# Patient Record
Sex: Male | Born: 1975
Health system: Southern US, Community
[De-identification: ages and names within clinical notes are randomized; demographics above are authoritative.]

## PROBLEM LIST (undated history)

## (undated) DIAGNOSIS — R7303 Prediabetes: Secondary | ICD-10-CM

## (undated) DIAGNOSIS — R7989 Other specified abnormal findings of blood chemistry: Secondary | ICD-10-CM

## (undated) DIAGNOSIS — T7840XA Allergy, unspecified, initial encounter: Secondary | ICD-10-CM

## (undated) DIAGNOSIS — R945 Abnormal results of liver function studies: Secondary | ICD-10-CM

## (undated) HISTORY — DX: Abnormal results of liver function studies: R94.5

## (undated) HISTORY — DX: Prediabetes: R73.03

## (undated) HISTORY — DX: Allergy, unspecified, initial encounter: T78.40XA

## (undated) HISTORY — DX: Other specified abnormal findings of blood chemistry: R79.89

---

## 2009-08-03 ENCOUNTER — Emergency Department (HOSPITAL_COMMUNITY): Admission: EM | Admit: 2009-08-03 | Discharge: 2009-08-03 | Payer: Self-pay | Admitting: Emergency Medicine

## 2011-02-02 ENCOUNTER — Emergency Department (HOSPITAL_COMMUNITY)
Admission: EM | Admit: 2011-02-02 | Discharge: 2011-02-02 | Discharge status: Against Medical Advice | Payer: No Typology Code available for payment source | Attending: Emergency Medicine | Admitting: Emergency Medicine

## 2011-02-02 DIAGNOSIS — Z0389 Encounter for observation for other suspected diseases and conditions ruled out: Secondary | ICD-10-CM | POA: Insufficient documentation

## 2012-03-31 ENCOUNTER — Other Ambulatory Visit: Payer: Self-pay | Admitting: Internal Medicine

## 2012-03-31 NOTE — Telephone Encounter (Signed)
PT STATES HIS PHARMACY HAVE BEEN TRYING TO GET HIS ZOLPIZEM REFILLED AND HASN'T HEARD FROM ANYONE PLEASE CALL PT AT [phone]   [facility] ON [address]

## 2012-07-07 ENCOUNTER — Encounter (HOSPITAL_COMMUNITY): Payer: Self-pay | Admitting: *Deleted

## 2012-07-07 ENCOUNTER — Emergency Department (HOSPITAL_COMMUNITY): Payer: Self-pay

## 2012-07-07 ENCOUNTER — Emergency Department (HOSPITAL_COMMUNITY)
Admission: EM | Admit: 2012-07-07 | Discharge: 2012-07-07 | Disposition: A | Discharge status: Home / Self Care | Payer: Self-pay | Attending: Emergency Medicine | Admitting: Emergency Medicine

## 2012-07-07 DIAGNOSIS — J40 Bronchitis, not specified as acute or chronic: Secondary | ICD-10-CM | POA: Insufficient documentation

## 2012-07-07 DIAGNOSIS — J019 Acute sinusitis, unspecified: Secondary | ICD-10-CM

## 2012-07-07 MED ORDER — BENZONATATE 100 MG PO CAPS: 100.0000 mg | ORAL_CAPSULE | Freq: Three times a day (TID) | ORAL | Status: AC

## 2012-07-07 MED ORDER — AZITHROMYCIN 250 MG PO TABS: 250.0000 mg | ORAL_TABLET | Freq: Every day | ORAL | Status: AC

## 2012-07-07 NOTE — ED Notes (Signed)
Pt reports nonproductive dry hacking cough x 3 weeks. denies any fevers.

## 2012-07-07 NOTE — ED Notes (Signed)
Provider at bedside

## 2012-07-07 NOTE — ED Provider Notes (Signed)
History     CSN: [id number]  Arrival date & time 07/07/12  1100   First MD Initiated Contact with Patient 07/07/12 1410      Chief Complaint  Patient presents with  . Cough    (Consider location/radiation/quality/duration/timing/severity/associated sxs/prior treatment) HPI Comments: Patient presents today with a chief complaint of dry cough, nasal congestion, and sinus pressure for the past 3 weeks.  He does not smoke and states that he has never smoked.    Patient is a 36 y.o. male presenting with cough. The history is provided by the patient.  Cough This is a new problem. Episode onset: 3 weeks ago. The problem has been gradually worsening. The cough is non-productive. There has been no fever. Associated symptoms include rhinorrhea. Pertinent negatives include no chest pain, no chills, no ear congestion, no ear pain, no sore throat, no shortness of breath and no wheezing. Treatments tried: Mucinex. The treatment provided no relief. He is not a smoker. His past medical history does not include bronchitis, pneumonia, COPD or asthma.    History reviewed. No pertinent past medical history.  History reviewed. No pertinent past surgical history.  History reviewed. No pertinent family history.  History  Substance Use Topics  . Smoking status: Never Smoker   . Smokeless tobacco: Not on file  . Alcohol Use:       Review of Systems  Constitutional: Negative for fever and chills.  HENT: Positive for congestion, rhinorrhea, postnasal drip and sinus pressure. Negative for ear pain and sore throat.   Respiratory: Positive for cough. Negative for chest tightness, shortness of breath and wheezing.   Cardiovascular: Negative for chest pain.    Allergies  Review of patient's allergies indicates no known allergies.  Home Medications   Current Outpatient Rx  Name Route Sig Dispense Refill  . GUAIFENESIN ER 1200 MG PO TB12 Oral Take 1 tablet by mouth every 12 (twelve) hours as needed.  As needed for cough and congestion.    Marland Kitchen ZOLPIDEM TARTRATE 10 MG PO TABS Oral Take 10 mg by mouth at bedtime as needed. As needed for sleep.      BP 123/82  Pulse 91  Temp 98.2 F (36.8 C) (Oral)  Resp 18  SpO2 99%  Physical Exam  Nursing note and vitals reviewed. Constitutional: He appears well-developed and well-nourished. No distress.  HENT:  Head: Normocephalic and atraumatic.  Right Ear: Tympanic membrane and ear canal normal.  Left Ear: Tympanic membrane and ear canal normal.  Nose: Mucosal edema present. Right sinus exhibits frontal sinus tenderness. Right sinus exhibits no maxillary sinus tenderness. Left sinus exhibits frontal sinus tenderness. Left sinus exhibits no maxillary sinus tenderness.  Mouth/Throat: Uvula is midline, oropharynx is clear and moist and mucous membranes are normal.  Neck: Normal range of motion. Neck supple.  Cardiovascular: Normal rate, regular rhythm and normal heart sounds.   Pulmonary/Chest: Effort normal and breath sounds normal. No respiratory distress. He has no wheezes. He has no rales.  Neurological: He is alert.  Skin: Skin is warm and dry. He is not diaphoretic.  Psychiatric: He has a normal mood and affect.    ED Course  Procedures (including critical care time)  Labs Reviewed - No data to display Dg Chest 2 View  07/07/2012  *RADIOLOGY REPORT*  Clinical Data: Cough.  CHEST - 2 VIEW  Comparison: 01/26/2011.  Findings: The heart size and mediastinal contours are stable. There is central airway thickening without hyperinflation.  There is no confluent airspace disease,  edema or pleural effusion.  The osseous structures appear normal.  IMPRESSION: Chronic central airway thickening consistent with bronchitis.  No evidence of pneumonia or other acute process.   Original Report Authenticated By: Gerrianne Scale, M.D.      No diagnosis found.    MDM  Xray consistent with bronchitis.  Patient does not smoke and states that he has never  smoked.  He is also complaining of sinus pressure and congestion for the past 3 weeks.  Patient treated with Azithromycin and instructed to take a decongestant.  VSS.  Pulse ox 99 on RA.  Patient discharged home.          Pascal Lux Huntington, PA-C 07/08/12 1133

## 2012-07-08 NOTE — ED Provider Notes (Signed)
Medical screening examination/treatment/procedure(s) were performed by non-physician practitioner and as supervising physician I was immediately available for consultation/collaboration. Devoria Albe, MD, Armando Gang   Ward Givens, MD 07/08/12 2011

## 2012-12-02 ENCOUNTER — Ambulatory Visit: Payer: Self-pay | Admitting: Emergency Medicine

## 2012-12-02 VITALS — BP 115/75 | HR 65 | Temp 97.6°F | Resp 16 | Ht 70.25 in | Wt 176.6 lb

## 2012-12-02 DIAGNOSIS — Z0289 Encounter for other administrative examinations: Secondary | ICD-10-CM

## 2012-12-02 NOTE — Progress Notes (Signed)
  Subjective:    Patient ID: Mitchell Schmidt, male    DOB: [birth year], 37 y.o.   MRN: [id number]  HPI  DOT physical   Review of Systems  As per HPI, otherwise negative.      Objective:   Physical Exam  GEN: WDWN, NAD, Non-toxic, A & O x 3 HEENT: Atraumatic, Normocephalic. Neck supple. No masses, No LAD. Ears and Nose: No external deformity. CV: RRR, No M/G/R. No JVD. No thrill. No extra heart sounds. PULM: CTA B, no wheezes, crackles, rhonchi. No retractions. No resp. distress. No accessory muscle use. ABD: S, NT, ND, +BS. No rebound. No HSM. EXTR: No c/c/e NEURO Normal gait.  PSYCH: Normally interactive. Conversant. Not depressed or anxious appearing.  Calm demeanor.        Assessment & Plan:    DOT completed

## 2012-12-08 ENCOUNTER — Encounter: Payer: Self-pay | Admitting: Emergency Medicine

## 2013-03-21 ENCOUNTER — Other Ambulatory Visit: Payer: Self-pay | Admitting: Physician Assistant

## 2013-03-22 ENCOUNTER — Telehealth: Payer: Self-pay

## 2013-03-22 NOTE — Telephone Encounter (Signed)
Pt is needing a refill on [employer] number [phone]

## 2013-03-22 NOTE — Telephone Encounter (Signed)
I dont see where we prescribed this.

## 2013-03-23 ENCOUNTER — Other Ambulatory Visit: Payer: Self-pay | Admitting: Internal Medicine

## 2013-03-23 ENCOUNTER — Ambulatory Visit: Payer: Self-pay | Admitting: Family Medicine

## 2013-03-23 VITALS — BP 120/80 | HR 90 | Temp 98.5°F | Resp 17 | Ht 70.5 in | Wt 178.0 lb

## 2013-03-23 DIAGNOSIS — G4700 Insomnia, unspecified: Secondary | ICD-10-CM

## 2013-03-23 DIAGNOSIS — Z131 Encounter for screening for diabetes mellitus: Secondary | ICD-10-CM

## 2013-03-23 LAB — COMPREHENSIVE METABOLIC PANEL
ALT: 62 U/L — ABNORMAL HIGH (ref 0–53)
Albumin: 4.5 g/dL (ref 3.5–5.2)
CO2: 26 mEq/L (ref 19–32)
Calcium: 9.5 mg/dL (ref 8.4–10.5)
Chloride: 103 mEq/L (ref 96–112)
Creat: 1.28 mg/dL (ref 0.50–1.35)
Sodium: 138 mEq/L (ref 135–145)
Total Protein: 8.1 g/dL (ref 6.0–8.3)

## 2013-03-23 LAB — COMPREHENSIVE METABOLIC PANEL WITH GFR
AST: 35 U/L (ref 0–37)
Alkaline Phosphatase: 97 U/L (ref 39–117)
BUN: 15 mg/dL (ref 6–23)
Glucose, Bld: 114 mg/dL — ABNORMAL HIGH (ref 70–99)
Potassium: 4.2 meq/L (ref 3.5–5.3)
Total Bilirubin: 0.4 mg/dL (ref 0.3–1.2)

## 2013-03-23 LAB — POCT GLYCOSYLATED HEMOGLOBIN (HGB A1C): Hemoglobin A1C: 6.3

## 2013-03-23 MED ORDER — ZOLPIDEM TARTRATE 10 MG PO TABS: 10.0000 mg | ORAL_TABLET | Freq: Every evening | ORAL | Status: AC | PRN

## 2013-03-23 NOTE — Progress Notes (Signed)
[facility]:  Office Visit  Chief Complaint:  Chief Complaint  Patient presents with  . Medication Refill    HPI: Mitchell Schmidt is a 37 y.o. male who is here for medication refill for Zolpidem.  Has problems falling asleep. Only occasional. No difficulty maintaining sleep. 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  He takes at most 2 ambiens weekly. He denies any abuse issues, he does not take medication before driving. He states he gives himself 10 hours before driving after med use.   He was told in the past that he may have diabetes at another clinic. He would like to get rechecked. I believe he was rx metformin due to dx of pre-diabetes but is not taking it. Denies neuropathy, denies excessive polydipsia.     Past Medical History  Diagnosis Date  . Pre-diabetes   . Allergy    History reviewed. No pertinent past surgical history. History   Social History  . Marital Status: Married    Spouse Name: N/A    Number of Children: N/A  . Years of Education: N/A   Social History Main Topics  . Smoking status: Never Smoker   . Smokeless tobacco: None  . Alcohol Use: No  . Drug Use: No  . Sexually Active: None   Other Topics Concern  . None   Social History Narrative  . None   History reviewed. No pertinent family history. No Known Allergies Prior to Admission medications   Medication Sig Start Date End Date Taking? Authorizing Provider  Guaifenesin [facility] MAXIMUM STRENGTH) 1200 MG TB12 Take 1 tablet by mouth every 12 (twelve) hours as needed. As needed for cough and congestion.   Yes Historical Provider, MD  metFORMIN (GLUCOPHAGE) 500 MG tablet Take 500 mg by mouth once.   Yes Historical Provider, MD  zolpidem  (AMBIEN) 10 MG tablet Take 10 mg by mouth at bedtime as needed. As needed for sleep.   Yes Historical Provider, MD     ROS: The patient denies fevers, chills, night sweats, unintentional weight loss, chest pain, palpitations, wheezing, dyspnea on exertion, nausea, vomiting, abdominal pain, dysuria, hematuria, melena, numbness, weakness, or tingling.   All other systems have been reviewed and were otherwise negative with the exception of those mentioned in the HPI and as above.    PHYSICAL EXAM: Filed Vitals:   03/23/13 1206  BP: 120/80  Pulse: 90  Temp: 98.5 F (36.9 C)  Resp: 17   Filed Vitals:   03/23/13 1206  Height: 5' 10.5" (1.791 m)  Weight: 178 lb (80.74 kg)   Body mass index is 25.17 kg/(m^2).  General: Alert, no acute distress HEENT:  Normocephalic, atraumatic, oropharynx patent. EOMI, PERRLA.  Cardiovascular:  Regular rate and rhythm, no rubs murmurs or gallops.  No Carotid bruits, radial pulse intact. No pedal edema.  Respiratory: Clear to auscultation bilaterally.  No wheezes, rales, or rhonchi.  No cyanosis, no use of accessory musculature GI: No organomegaly, abdomen is soft and non-tender, positive bowel sounds.  No masses. Skin: No rashes. Neurologic: Facial musculature symmetric. Psychiatric: Patient is appropriate throughout our interaction. Lymphatic: No cervical lymphadenopathy Musculoskeletal: Gait intact.   LABS: Results for orders placed  in visit on 03/23/13  POCT GLYCOSYLATED HEMOGLOBIN (HGB A1C)      Result Value Range   Hemoglobin A1C 6.3       EKG/XRAY:   Primary read interpreted by Dr. Conley Rolls at [facility]   ASSESSMENT/PLAN: Encounter Diagnoses  Name Primary?  . Screening for diabetes mellitus Yes  . Insomnia     D/w patient isk and benefits of taking ambien. Advise to take 1/2 -1 tab PO qhs prn insomnia Rx Ambiem 10 mg #30, 3 Refills written D/w  patient pre-diabetes dx  and what he needs to do Patient is not taking metformin and his HbA1c  is 6.3. C/w lifestyle changes.  He will folllow-up in 6 months for check up and repeat HbA1c. CMP pending F/u prn  Jamison Soward PHUONG, DO 03/23/2013 1:14 PM

## 2013-03-23 NOTE — Telephone Encounter (Signed)
I have called him, left message for him to call back

## 2013-03-23 NOTE — Telephone Encounter (Signed)
Patient advised.

## 2013-03-23 NOTE — Telephone Encounter (Signed)
PATIENT CALLED BACK. [phone]

## 2013-03-24 ENCOUNTER — Encounter: Payer: Self-pay | Admitting: Family Medicine

## 2013-03-24 MED ORDER — METFORMIN HCL 500 MG PO TABS: 500.0000 mg | ORAL_TABLET | Freq: Once | ORAL | Status: DC

## 2013-04-01 ENCOUNTER — Telehealth: Payer: Self-pay | Admitting: Family Medicine

## 2013-04-01 NOTE — Telephone Encounter (Signed)
Spoke to patient about labs, he is not on metformin. He will return in 6 months to see if he needs to be on it. HbA1c was 6.3. He will modify lifestyle. CMP was fairly normal.

## 2013-09-28 ENCOUNTER — Other Ambulatory Visit: Payer: Self-pay | Admitting: Family Medicine

## 2013-11-01 ENCOUNTER — Ambulatory Visit (INDEPENDENT_AMBULATORY_CARE_PROVIDER_SITE_OTHER): Payer: BC Managed Care – PPO | Admitting: Family Medicine

## 2013-11-01 VITALS — BP 100/62 | HR 71 | Temp 98.8°F | Resp 18 | Ht 70.5 in | Wt 175.0 lb

## 2013-11-01 DIAGNOSIS — Z131 Encounter for screening for diabetes mellitus: Secondary | ICD-10-CM

## 2013-11-01 DIAGNOSIS — G4700 Insomnia, unspecified: Secondary | ICD-10-CM

## 2013-11-01 DIAGNOSIS — R350 Frequency of micturition: Secondary | ICD-10-CM

## 2013-11-01 LAB — POCT UA - MICROSCOPIC ONLY
Bacteria, U Microscopic: NEGATIVE
Casts, Ur, LPF, POC: NEGATIVE
Crystals, Ur, HPF, POC: NEGATIVE
Epithelial cells, urine per micros: NEGATIVE
Mucus, UA: NEGATIVE
Yeast, UA: NEGATIVE

## 2013-11-01 LAB — POCT URINALYSIS DIPSTICK
Bilirubin, UA: NEGATIVE
Blood, UA: NEGATIVE
Glucose, UA: NEGATIVE
Ketones, UA: NEGATIVE
Leukocytes, UA: NEGATIVE
Nitrite, UA: NEGATIVE
Protein, UA: NEGATIVE
Spec Grav, UA: 1.02
Urobilinogen, UA: 0.2
pH, UA: 7

## 2013-11-01 LAB — POCT GLYCOSYLATED HEMOGLOBIN (HGB A1C): Hemoglobin A1C: 6.3

## 2013-11-01 MED ORDER — ZOLPIDEM TARTRATE 10 MG PO TABS: 10.0000 mg | ORAL_TABLET | Freq: Every evening | ORAL | Status: DC | PRN

## 2013-11-01 NOTE — Progress Notes (Signed)
Chief Complaint:  Chief Complaint  Patient presents with  . Follow-up    dm     HPI: Mitchell Schmidt is a 37 y.o. male who is here for: Mitchell Schmidt is a 37 y.o. male who is here for medication refill for Zolpidem. Has problems falling asleep. Only occasional. No difficulty maintaining sleep. 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 He takes at most 2 ambiens weekly. He denies any abuse issues, he does not take medication before driving. He states he gives himself 10 hours before driving after med use.  He was told in the past that he may have diabetes at another clinic. He would like to get rechecked. I believe he was rx metformin due to dx of pre-diabetes but is not taking it. We are monitoring his HbA1c last one was 6.3. Denies neuropathy, denies excessive polydipsia. Last 2 weeks he has had increase urinary frequency only at night  Past Medical History  Diagnosis Date  . Allergy   . Pre-diabetes    History reviewed. No pertinent past surgical history. History   Social History  . Marital Status: Married    Spouse Name: N/A    Number of Children: N/A  . Years of Education: N/A   Social History Main Topics  . Smoking status: Never Smoker   . Smokeless tobacco: None  . Alcohol Use: No  . Drug Use: No  . Sexual Activity: None   Other Topics Concern  . None   Social History Narrative  . None   History reviewed. No pertinent family history. No Known Allergies Prior to Admission medications   Medication Sig Start Date End Date Taking? Authorizing Provider  metFORMIN (GLUCOPHAGE) 500 MG tablet Take 500 mg by mouth 2 (two) times daily with a meal.   Yes Historical Provider, MD  zolpidem (AMBIEN) 10 MG tablet take 1 tablet by  mouth at bedtime if needed for sleep 09/28/13   Odyn Turko P Candice Tobey, DO     ROS: The patient denies fevers, chills, night sweats, unintentional weight loss, chest pain, palpitations, wheezing, dyspnea on exertion, nausea, vomiting, abdominal pain, dysuria, hematuria, melena, numbness, weakness, or tingling.   All other systems have been reviewed and were otherwise negative with the exception of those mentioned in the HPI and as above.    PHYSICAL EXAM: Filed Vitals:   11/01/13 1122  BP: 100/62  Pulse: 71  Temp: 98.8 F (37.1 C)  Resp: 18   Filed Vitals:   11/01/13 1122  Height: 5' 10.5" (1.791 m)  Weight: 175 lb (79.379 kg)   Body mass index is 24.75 kg/(m^2).  General: Alert, no acute distress HEENT:  Normocephalic, atraumatic, oropharynx patent. EOMI, PERRLA Cardiovascular:  Regular rate and rhythm, no rubs murmurs or gallops.  No Carotid bruits, radial pulse intact. No pedal edema.  Respiratory: Clear to auscultation bilaterally.  No wheezes, rales, or rhonchi.  No cyanosis, no use of accessory musculature GI: No organomegaly, abdomen is soft and non-tender, positive bowel sounds.  No masses. Skin: No rashes. Neurologic: Facial musculature symmetric. Psychiatric: Patient is appropriate throughout our interaction. Lymphatic: No cervical lymphadenopathy Musculoskeletal: Gait intact.   LABS: Results for orders placed in visit  on 11/01/13  POCT GLYCOSYLATED HEMOGLOBIN (HGB A1C)      Result Value Range   Hemoglobin A1C 6.3    POCT UA - MICROSCOPIC ONLY      Result Value Range   WBC, Ur, HPF, POC 0-1     RBC, urine, microscopic 1-3     Bacteria, U Microscopic neg     Mucus, UA neg     Epithelial cells, urine per micros neg     Crystals, Ur, HPF, POC neg     Casts, Ur, LPF, POC neg     Yeast, UA neg    POCT URINALYSIS DIPSTICK      Result Value Range   Color, UA yellow     Clarity, UA clear     Glucose, UA neg     Bilirubin, UA neg     Ketones, UA neg     Spec Grav, UA  1.020     Blood, UA neg     pH, UA 7.0     Protein, UA neg     Urobilinogen, UA 0.2     Nitrite, UA neg     Leukocytes, UA Negative       EKG/XRAY:   Primary read interpreted by Dr. Conley Rolls at [facility].   ASSESSMENT/PLAN: Encounter Diagnoses  Name Primary?  . Screening for diabetes mellitus Yes  . Increased frequency of urination   . Insomnia    Refilled Ambien Will do diet control for  Pre diabetes F/u in 1 year or sooner if have DM sxs ie polydipsia or polyuria  Gross sideeffects, risk and benefits, and alternatives of medications d/w patient. Patient is aware that all medications have potential sideeffects and we are unable to predict every sideeffect or drug-drug interaction that may occur.  Millard Bautch PHUONG, DO 11/01/2013 1:05 PM

## 2013-12-25 ENCOUNTER — Ambulatory Visit (INDEPENDENT_AMBULATORY_CARE_PROVIDER_SITE_OTHER): Payer: BC Managed Care – PPO | Admitting: Internal Medicine

## 2013-12-25 VITALS — BP 112/70 | HR 74 | Temp 98.5°F | Resp 18 | Ht 70.5 in | Wt 172.0 lb

## 2013-12-25 DIAGNOSIS — R5383 Other fatigue: Principal | ICD-10-CM

## 2013-12-25 DIAGNOSIS — R52 Pain, unspecified: Secondary | ICD-10-CM

## 2013-12-25 DIAGNOSIS — R5381 Other malaise: Secondary | ICD-10-CM

## 2013-12-25 DIAGNOSIS — R945 Abnormal results of liver function studies: Secondary | ICD-10-CM

## 2013-12-25 DIAGNOSIS — R7989 Other specified abnormal findings of blood chemistry: Secondary | ICD-10-CM

## 2013-12-25 LAB — COMPLETE METABOLIC PANEL WITH GFR
ALBUMIN: 4.2 g/dL (ref 3.5–5.2)
ALK PHOS: 73 U/L (ref 39–117)
ALT: 46 U/L (ref 0–53)
AST: 37 U/L (ref 0–37)
BUN: 14 mg/dL (ref 6–23)
CO2: 30 mEq/L (ref 19–32)
Calcium: 8.9 mg/dL (ref 8.4–10.5)
Chloride: 103 mEq/L (ref 96–112)
Creat: 1.15 mg/dL (ref 0.50–1.35)
GFR, Est African American: >89 mL/min
GFR, Est Non African American: 80 mL/min
Glucose, Bld: 84 mg/dL (ref 70–99)
POTASSIUM: 4 meq/L (ref 3.5–5.3)
SODIUM: 139 meq/L (ref 135–145)
TOTAL PROTEIN: 7.4 g/dL (ref 6.0–8.3)
Total Bilirubin: 0.3 mg/dL (ref 0.2–1.2)

## 2013-12-25 LAB — POCT CBC
GRANULOCYTE PERCENT: 45 % (ref 37–80)
HCT, POC: 42.5 % — AB (ref 43.5–53.7)
Hemoglobin: 13.5 g/dL — AB (ref 14.1–18.1)
Lymph, poc: 1.5 (ref 0.6–3.4)
MCH, POC: 31.5 pg — AB (ref 27–31.2)
MCHC: 31.8 g/dL (ref 31.8–35.4)
MCV: 99 fL — AB (ref 80–97)
MID (CBC): 0.4 (ref 0–0.9)
MPV: 9.5 fL (ref 0–99.8)
PLATELET COUNT, POC: 218 10*3/uL (ref 142–424)
POC Granulocyte: 1.5 — AB (ref 2–6.9)
POC LYMPH PERCENT: 44.3 %L (ref 10–50)
POC MID %: 10.7 % (ref 0–12)
RBC: 4.29 M/uL — AB (ref 4.69–6.13)
RDW, POC: 13.4 %
WBC: 3.4 10*3/uL — AB (ref 4.6–10.2)

## 2013-12-25 LAB — POCT UA - MICROSCOPIC ONLY
CRYSTALS, UR, HPF, POC: NEGATIVE
Casts, Ur, LPF, POC: NEGATIVE
Mucus, UA: POSITIVE
YEAST UA: NEGATIVE

## 2013-12-25 LAB — POCT URINALYSIS DIPSTICK
Bilirubin, UA: NEGATIVE
GLUCOSE UA: NEGATIVE
Ketones, UA: NEGATIVE
Leukocytes, UA: NEGATIVE
NITRITE UA: NEGATIVE
PROTEIN UA: 30
RBC UA: NEGATIVE
Spec Grav, UA: >=1.03
UROBILINOGEN UA: 0.2
pH, UA: 6

## 2013-12-25 MED ORDER — DOXYCYCLINE HYCLATE 100 MG PO TABS: 100.0000 mg | ORAL_TABLET | Freq: Two times a day (BID) | ORAL | Status: DC

## 2013-12-25 NOTE — Progress Notes (Signed)
Subjective:    Patient ID: Mitchell Schmidt, male    DOB: Feb 09, 1976, 38 y.o.   MRN: [id number]  HPI pt presents today with chills that started 3 days ago. He also complains of fatigue that started 2 months ago. He has loss of appetite. Denies vomiting. History of elevated liver function. 62 sgpt, no others elevated. No rash, no ticks, no gi or gu sxs. No pain except body aches.    Review of Systems  Constitutional: Positive for fever, chills, activity change, appetite change and fatigue. Negative for diaphoresis and unexpected weight change.  HENT: Negative.   Eyes: Negative.   Respiratory: Negative.   Cardiovascular: Negative.   Gastrointestinal: Positive for nausea. Negative for vomiting, abdominal pain, diarrhea, constipation, blood in stool, abdominal distention, anal bleeding and rectal pain.  Endocrine: Negative.   Genitourinary: Negative.   Skin: Negative.   Allergic/Immunologic: Negative.   Neurological: Negative.   Hematological: Negative.   Psychiatric/Behavioral: Negative.        Objective:   Physical Exam  Constitutional: He is oriented to person, place, and time. He appears well-developed and well-nourished. No distress.  HENT:  Head: Normocephalic.  Right Ear: External ear normal.  Left Ear: External ear normal.  Nose: Nose normal.  Mouth/Throat: Oropharynx is clear and moist.  Eyes: Conjunctivae and EOM are normal. Pupils are equal, round, and reactive to light. No scleral icterus.  Neck: Normal range of motion.  Cardiovascular: Normal rate, regular rhythm and normal heart sounds.   Pulmonary/Chest: Effort normal and breath sounds normal.  Abdominal: Soft. Bowel sounds are normal. He exhibits no mass. There is no tenderness.  Musculoskeletal: Normal range of motion.  Neurological: He is alert and oriented to person, place, and time. No cranial nerve deficit. He exhibits normal muscle tone. Coordination normal.  Skin: No rash noted.  Psychiatric: He has a  normal mood and affect. His behavior is normal. Judgment and thought content normal.   Results for orders placed in visit on 12/25/13  POCT CBC      Result Value Ref Range   WBC 3.4 (*) 4.6 - 10.2 K/uL   Lymph, poc 1.5  0.6 - 3.4   POC LYMPH PERCENT 44.3  10 - 50 %L   MID (cbc) 0.4  0 - 0.9   POC MID % 10.7  0 - 12 %M   POC Granulocyte 1.5 (*) 2 - 6.9   Granulocyte percent 45.0  37 - 80 %G   RBC 4.29 (*) 4.69 - 6.13 M/uL   Hemoglobin 13.5 (*) 14.1 - 18.1 g/dL   HCT, POC 30.842.5 (*) 65.743.5 - 53.7 %   MCV 99.0 (*) 80 - 97 fL   MCH, POC 31.5 (*) 27 - 31.2 pg   MCHC 31.8  31.8 - 35.4 g/dL   RDW, POC 84.613.4     Platelet Count, POC 218  142 - 424 K/uL   MPV 9.5  0 - 99.8 fL  POCT URINALYSIS DIPSTICK      Result Value Ref Range   Color, UA dark yellow     Clarity, UA clear     Glucose, UA negative     Bilirubin, UA negative     Ketones, UA negative     Spec Grav, UA >=1.030     Blood, UA negative     pH, UA 6.0     Protein, UA 30     Urobilinogen, UA 0.2     Nitrite, UA negative     Leukocytes,  UA Negative    POCT UA - MICROSCOPIC ONLY      Result Value Ref Range   WBC, Ur, HPF, POC 0-1     RBC, urine, microscopic 0-2     Bacteria, U Microscopic trace     Mucus, UA positive     Epithelial cells, urine per micros 0-1     Crystals, Ur, HPF, POC negative     Casts, Ur, LPF, POC negative     Yeast, UA negative            Assessment & Plan:  Fatigue/Body aches/Fever Doxycycline 100mg  bid RTC Friday am

## 2013-12-25 NOTE — Progress Notes (Signed)
   Subjective:    Patient ID: Mitchell Schmidt, male    DOB: May 08, 1976, 38 y.o.   MRN: [id number]  HPI    Review of Systems     Objective:   Physical Exam        Assessment & Plan:

## 2013-12-25 NOTE — Patient Instructions (Signed)
[place] Spotted Fever [place] Sp[place] Fever (RMSF) is the oldest known tick-borne disease of people in the [place]. This disease was named because it was first described among people in the [place] area who had an illness characterized by a rash with red-purple-black spots. This disease is caused by a rickettsia (Rickettsia rickettsii), a bacteria carried by the tick. The [place] wood tick and the [place] dog tick, acquire and transmit the RMSF bacteria (pictures NOT actual size). When a larval, nymphal or adult tick feeds on an infected rodent or larger animal, the tick can become infected. Infected adult ticks then feed on people who may then get RMSF. The tick transmits the disease to humans during a prolonged period of feeding that lasts many hours, days or even a couple weeks. The bite is painless and frequently goes unnoticed. An infected male tick may also pass the rickettsial bacteria to her eggs that then may mature to be infected adult ticks. The rickettsia that causes RMSF can also get into a person's body through damaged skin. A tick bite is not necessary. People can get RMSF if they crush a tick and get it's blood or body fluids on their skin through a small cut or sore.  DIAGNOSIS Diagnosis is made by laboratory tests.  TREATMENT Treatment is with antibiotics (medications that kill rickettsia and other bacteria). Immediate treatment usually prevents death. GEOGRAPHIC RANGE This disease was reported only in the [place] until 1931. RMSF has more recently been described among individuals in all states except [place], [place] and [place]. The highest reported incidences of RMSF now occur among residents of [place], [place], [place] and the [place]. TIME OF YEAR  Most cases are diagnosed during late spring and summer when ticks are most active. However, especially in the warmer southern states, a few cases occur during the winter. SYMPTOMS    Symptoms of RMSF begin from 2 to 14 days after a tick bite. The most common early symptoms are fever, muscle aches and headache followed by nausea (feeling sick to your stomach) or vomiting.  The RMSF rash is typically delayed until 3 or more days after symptom onset, and eventually develops in 9 of 10 infected patients by the 5th day of illness. If the disease is not treated it can cause death. If you get a fever, headache, muscle aches, rash, nausea or vomiting within 2 weeks of a possible tick bite or exposure you should see your caregiver immediately. PREVENTION Ticks prefer to hide in shady, moist ground litter. They can often be found above the ground clinging to tall grass, brush, shrubs and low tree branches. They also inhabit lawns and gardens, especially at the edges of woodlands and around old stone walls. Within the areas where ticks generally live, no naturally vegetated area can be considered completely free of infected ticks. The best precaution against RMSF is to avoid contact with soil, leaf litter and vegetation as much as possible in tick infested areas. For those who enjoy gardening or walking in their yards, clear brush and mow tall grass around houses and at the edges of gardens. This may help reduce the tick population in the immediate area. Applications of chemical insecticides by a licensed professional in the spring (late May) and Fall (September) will also control ticks, especially in heavily infested areas. Treatment will never get rid of all the ticks. Getting rid of small animal populations that host ticks will also decrease the tick population. When working in the garden,   pruning shrubs, or handling soil and vegetation, wear light-colored protective clothing and gloves. Spot-check often to prevent ticks from reaching the skin. Ticks cannot jump or fly. They will not drop from an above-ground perch onto a passing animal. Once a tick gains access to human skin it climbs upward  until it reaches a more protected area. For example, the back of the knee, groin, navel, armpit, ears or nape of the neck. It then begins the slow process of embedding itself in the skin. Campers, hikers, field workers, and others who spend time in wooded, brushy or tall grassy areas can avoid exposure to ticks by using the following precautions:  Wear light-colored clothing with a tight weave to spot ticks more easily and prevent contact with the skin.  Wear long pants tucked into socks, long-sleeved shirts tucked into pants and enclosed shoes or boots along with insect repellent.  Spray clothes with insect repellent containing either DEET or Permethrin. Only DEET can be used on exposed skin. Follow the manufacturer's directions carefully.  Wear a hat and keep long hair pulled back.  Stay on cleared, well-worn trails whenever possible.  Spot-check yourself and others often for the presence of ticks on clothes. If you find one, there are likely to be others. Check thoroughly.  Remove clothes after leaving tick-infested areas. If possible, wash them to eliminate any unseen ticks. Check yourself, your children and any pets from head to toe for the presence of ticks.  Shower and shampoo. You can greatly reduce your chances of contracting RMSF if you remove attached ticks as soon as possible. Regular checks of the body, including all body sites covered by hair (head, armpits, genitals), allow removal of the tick before rickettsial transmission. To remove an attached tick, use a forceps or tweezers to detach the intact tick without leaving mouth parts in the skin. The tick bite wound should be cleansed after tick removal. Remember the most common symptoms of RMSF are fever, muscle aches, headache and nausea or vomiting with a later onset of rash. If you get these symptoms after a tick bite and while living in an area where RMSF is found, RMSF should be suspected. If the disease is not treated, it can  cause death. See your caregiver immediately if you get these symptoms. Do this even if not aware of a tick bite. Document Released: 02/07/2001 Document Revised: 01/18/2012 Document Reviewed: 09/30/2009 [facility] Patient Information 2014 [place], [place]. Hepatitis Virus Studies Hepatitis is an inflammation of the liver which can be caused by viruses, alcohol intake, various drugs, toxins, or bad infections caused by bacteria. The three most common viruses now recognized to cause disease are hepatitis A, hepatitis B, and hepatitis C (also called non A / non B). Hepatitis D and E are other forms of viruses. Most often the hepatitis viruses are associated with the enzymes found in the liver. These enzymes include the AST, ALT, and LDH. These are tests used to measure the likelihood of you having one of these viral infections.  The tests that are often done for liver function are: Serologic findings: HAV-AB/IgM  Hepatitis Testing Appearance/disappearance: 4-6 weeks/3-4 months  Application: Acute HAV infection Serologic findings: HAV-Ab/IgG  Hepatitis Testing Appearance/disappearance: 8-12 weeks/10years  Application: Previous HAV exposure Serologic findings: HBeAg  Hepatitis Testing Appearance/disappearance: 1-3 weeks/6-8 weeks  Application: Acute HBV infection Serologic findings: HBeAb  Hepatitis Testing Appearance/disappearance: 4-6 weeks/4-6 years  Application: Acute HBV infection ended Serologic findings: HBsAg  Hepatitis Testing Appearance/disappearance: 4-12 weeks/1-3 months  Application:  Acute HBV infection Serologic findings: HBsAb total  Hepatitis Testing Appearance/disappearance: 3-10 months/6-10 years  Application: Previous HBV infection Serologic findings: HBVc-Ab/IgM  Hepatitis Testing Appearance/disappearance: 2-12 weeks/3-6 months  Application: Acute HBV infection Serologic findings: HBVc-Ab total  Hepatitis Testing Appearance/disappearance: 3-12  weeks/life  Application: Previous HBV infection Serologic findings: HCV-Ab/IgG  Hepatitis Testing Appearance/disappearance: 3-4 months/2years  Application: Previous HCV infection Serologic findings: HDV Ag  Hepatitis Testing Appearance/disappearance: 1-3 days/3-5 days  Application: Acute HDV infection Serologic findings: HDV-Ab total  Hepatitis Testing Appearance/disappearance: 2-3 months/7-14 months  Application: Chronic HDV infection PREPARATION FOR TEST No preparation or fasting is necessary. A blood sample is obtained by inserting a needle into a vein in the arm. NORMAL FINDINGS Negative Ranges for normal findings may vary among different laboratories and hospitals. You should always check with your doctor after having lab work or other tests done to discuss the meaning of your test results and whether your values are considered within normal limits. MEANING OF TEST  Your caregiver will go over the test results with you and discuss the importance and meaning of your results, as well as treatment options and the need for additional tests if necessary. OBTAINING THE TEST RESULTS It is your responsibility to obtain your test results. Ask the lab or department performing the test when and how you will get your results. Document Released: 11/28/2004 Document Revised: 01/18/2012 Document Reviewed: 10/06/2008 [facility] Patient Information 2014 [place], [place].

## 2013-12-26 LAB — HEPATITIS PANEL, ACUTE
HCV Ab: NEGATIVE
HEP A IGM: NONREACTIVE
HEP B S AG: NEGATIVE
Hep B C IgM: NONREACTIVE

## 2013-12-26 LAB — HIV ANTIBODY (ROUTINE TESTING W REFLEX): HIV: NONREACTIVE

## 2014-09-10 ENCOUNTER — Ambulatory Visit (INDEPENDENT_AMBULATORY_CARE_PROVIDER_SITE_OTHER): Payer: BC Managed Care – PPO | Admitting: Family Medicine

## 2014-09-10 VITALS — BP 130/86 | HR 82 | Temp 97.8°F | Resp 16 | Ht 71.25 in | Wt 178.8 lb

## 2014-09-10 DIAGNOSIS — R7303 Prediabetes: Secondary | ICD-10-CM | POA: Insufficient documentation

## 2014-09-10 DIAGNOSIS — Z1329 Encounter for screening for other suspected endocrine disorder: Secondary | ICD-10-CM

## 2014-09-10 DIAGNOSIS — Z13220 Encounter for screening for lipoid disorders: Secondary | ICD-10-CM

## 2014-09-10 DIAGNOSIS — G4700 Insomnia, unspecified: Secondary | ICD-10-CM | POA: Insufficient documentation

## 2014-09-10 DIAGNOSIS — Z113 Encounter for screening for infections with a predominantly sexual mode of transmission: Secondary | ICD-10-CM

## 2014-09-10 DIAGNOSIS — R7309 Other abnormal glucose: Secondary | ICD-10-CM

## 2014-09-10 DIAGNOSIS — R079 Chest pain, unspecified: Secondary | ICD-10-CM

## 2014-09-10 DIAGNOSIS — Z0000 Encounter for general adult medical examination without abnormal findings: Secondary | ICD-10-CM

## 2014-09-10 HISTORY — DX: Prediabetes: R73.03

## 2014-09-10 LAB — POCT CBC
GRANULOCYTE PERCENT: 39.3 % (ref 37–80)
HCT, POC: 45.4 % (ref 43.5–53.7)
Hemoglobin: 14.8 g/dL (ref 14.1–18.1)
Lymph, poc: 2.4 (ref 0.6–3.4)
MCH, POC: 31.3 pg — AB (ref 27–31.2)
MCHC: 32.6 g/dL (ref 31.8–35.4)
MCV: 96.1 fL (ref 80–97)
MID (CBC): 0.3 (ref 0–0.9)
MPV: 8.2 fL (ref 0–99.8)
PLATELET COUNT, POC: 243 10*3/uL (ref 142–424)
POC GRANULOCYTE: 1.7 — AB (ref 2–6.9)
POC LYMPH %: 54.2 % — AB (ref 10–50)
POC MID %: 6.5 % (ref 0–12)
RBC: 4.72 M/uL (ref 4.69–6.13)
RDW, POC: 13.7 %
WBC: 4.4 10*3/uL — AB (ref 4.6–10.2)

## 2014-09-10 LAB — POCT URINALYSIS DIPSTICK
Bilirubin, UA: NEGATIVE
Blood, UA: NEGATIVE
GLUCOSE UA: NEGATIVE
Ketones, UA: NEGATIVE
Leukocytes, UA: NEGATIVE
NITRITE UA: NEGATIVE
PROTEIN UA: NEGATIVE
SPEC GRAV UA: 1.025
UROBILINOGEN UA: 0.2
pH, UA: 6

## 2014-09-10 LAB — COMPREHENSIVE METABOLIC PANEL
ALBUMIN: 4.5 g/dL (ref 3.5–5.2)
ALK PHOS: 75 U/L (ref 39–117)
ALT: 40 U/L (ref 0–53)
AST: 32 U/L (ref 0–37)
BUN: 15 mg/dL (ref 6–23)
CALCIUM: 9.5 mg/dL (ref 8.4–10.5)
CHLORIDE: 103 meq/L (ref 96–112)
CO2: 30 mEq/L (ref 19–32)
Creat: 1.24 mg/dL (ref 0.50–1.35)
Glucose, Bld: 109 mg/dL — ABNORMAL HIGH (ref 70–99)
POTASSIUM: 4.2 meq/L (ref 3.5–5.3)
SODIUM: 139 meq/L (ref 135–145)
TOTAL PROTEIN: 7.9 g/dL (ref 6.0–8.3)
Total Bilirubin: 0.5 mg/dL (ref 0.2–1.2)

## 2014-09-10 LAB — POCT UA - MICROSCOPIC ONLY
BACTERIA, U MICROSCOPIC: NEGATIVE
CASTS, UR, LPF, POC: NEGATIVE
CRYSTALS, UR, HPF, POC: NEGATIVE
Mucus, UA: POSITIVE
YEAST UA: NEGATIVE

## 2014-09-10 LAB — TSH: TSH: 1.072 u[IU]/mL (ref 0.350–4.500)

## 2014-09-10 LAB — HIV ANTIBODY (ROUTINE TESTING W REFLEX): HIV: NONREACTIVE

## 2014-09-10 LAB — POCT GLYCOSYLATED HEMOGLOBIN (HGB A1C): HEMOGLOBIN A1C: 6.3

## 2014-09-10 LAB — HEPATITIS C ANTIBODY: HCV Ab: NEGATIVE

## 2014-09-10 LAB — RPR

## 2014-09-10 MED ORDER — OMEPRAZOLE 40 MG PO CPDR: 40.0000 mg | DELAYED_RELEASE_CAPSULE | Freq: Every day | ORAL | Status: DC

## 2014-09-10 MED ORDER — ZOLPIDEM TARTRATE 10 MG PO TABS: 10.0000 mg | ORAL_TABLET | Freq: Every evening | ORAL | Status: DC | PRN

## 2014-09-10 NOTE — Progress Notes (Signed)
Subjective:  This chart was scribed for Mitchell Sorenson, MD by Carl Best, Medical Scribe. This patient was seen in Room 1 and the patient's care was started at 8:50 AM.   Patient ID: Mitchell Schmidt, male    DOB: 14-Nov-1975, 38 y.o.   MRN: [id number]  There are no active problems to display for this patient.  HPI HPI Comments: Mitchell Schmidt is a 38 y.o. male with a history of pre-diabetes and elevated LFTs who presents to the [facility] for a complete physical exam.  He does not have a history of a fasting lipid panel but does have a history of a negative acute hepatitis panel.  He had a hemoglobin A1C of 6.3.  He has not made any changes in his diet since he was diagnosed with pre-diabetes but states that he exercises more.  He was counseled on what foods to avoid.  He had a [place]dap in June 2015 when [place].  He has not had a flu shot since last year and would not like to receive one today.  He does not have a family history of cancer, TIA, or MI.  He uses Ambien as needed and would like a refill.  He does not have a history of smoking.    He is complaining of intermittent tightness on the right side of his chest when he turns his head that started a month ago.  He states that he will feel occasionally experience these symptoms when he sleeps.  He does not experience chest pain with physical activity and has never taken medication for his symptoms.  He does not have a history of heartburn.  He is also complaining of intermittent pain on his left lateral epicondyle.    Past Medical History  Diagnosis Date  . Allergy   . Pre-diabetes   . Elevated LFTs    History reviewed. No pertinent past surgical history. History reviewed. No pertinent family history. History   Social History  . Marital Status: Married    Spouse Name: N/A    Number of Children: N/A  . Years of Education: N/A   Occupational History  . Not on file.   Social History Main  Topics  . Smoking status: Never Smoker   . Smokeless tobacco: Not on file  . Alcohol Use: 0.6 - 1.2 oz/week    1-2 Not specified per week  . Drug Use: No  . Sexual Activity: Not on file   Other Topics Concern  . Not on file   Social History Narrative   No Known Allergies  Review of Systems  HENT: Positive for rhinorrhea and sneezing.   Cardiovascular: Positive for chest pain.  All other systems reviewed and are negative.    BP 130/86 mmHg  Pulse 82  Temp(Src) 97.8 F (36.6 C) (Oral)  Resp 16  Ht 5' 11.25" (1.81 m)  Wt 178 lb 12.8 oz (81.103 kg)  BMI 24.76 kg/m2  SpO2 98% Objective:  Physical Exam  Constitutional: He is oriented to person, place, and time. He appears well-developed and well-nourished.  HENT:  Head: Normocephalic and atraumatic.  Right Ear: Tympanic membrane, external ear and ear canal normal.  Left Ear: Hearing, external ear and ear canal normal. Tympanic membrane is injected and retracted.  Nose: Mucosal edema and rhinorrhea present.  Mouth/Throat: Uvula is midline, oropharynx is clear and moist and mucous membranes are normal.  Pale, boggy mucosa.    Eyes: EOM are normal.  Neck: Normal range of motion.  Cardiovascular: Normal rate, regular rhythm, S1 normal, S2 normal and normal heart sounds.   No murmur heard. Pulmonary/Chest: Effort normal and breath sounds normal. No respiratory distress. He has no wheezes. He has no rales. He exhibits no tenderness.  Abdominal: Soft. Bowel sounds are normal. He exhibits no distension. There is no hepatosplenomegaly. There is no tenderness.  Musculoskeletal: Normal range of motion.  No pain with resisted wrist flexion or extension bilaterally.   Neurological: He is alert and oriented to person, place, and time.  Reflex Scores:      Patellar reflexes are 2+ on the right side and 2+ on the left side. Skin: Skin is warm and dry.  Psychiatric: He has a normal mood and affect. His behavior is normal.  Nursing note  and vitals reviewed.  EKG - normal sinus rhythm with no ischemic changes.    Assessment & Plan:  Patient declined a flu shot today.    Annual physical exam - Plan: EKG 12-Lead, POCT glycosylated hemoglobin (Hb A1C)  Screen for STD (sexually transmitted disease) - Plan: HIV antibody, RPR, Hepatitis C antibody, Trichomonas vaginalis, RNA, GC/Chlamydia Probe Amp, CANCELED: GC/Chlamydia Probe Amp - was not Lee to void enough to collect uriprobe - recommend RTC 11/5 for first morning void lab only visit - orders entered.  Screening for hyperlipidemia - Plan: POCT CBC, Comprehensive metabolic panel, POCT UA - Microscopic Only, POCT urinalysis dipstick, Lipid panel, CANCELED: LDL cholesterol, direct - Get FLP at f/u lab only visit in 3d.  Screening for thyroid disorder - Plan: POCT CBC, TSH  Insomnia - ok to cont zolpidem prn - does not use regularly - will need f/u OV for any additional refills.  Prediabetes - stable w/ a1c still at 6.3, cont exercise and low carb diet reviewed and handout given  Chest pain, unspecified chest pain type - suspect due to esophageal spasms - very atypical for cardiac.  Try ppi but if persists, RTC asap.  Meds ordered this encounter  Medications  . zolpidem (AMBIEN) 10 MG tablet    Sig: Take 1 tablet (10 mg total) by mouth at bedtime as needed for sleep.    Dispense:  30 tablet    Refill:  5  . omeprazole (PRILOSEC) 40 MG capsule    Sig: Take 1 capsule (40 mg total) by mouth daily.    Dispense:  30 capsule    Refill:  1    I personally performed the services described in this documentation, which was scribed in my presence. The recorded information has been reviewed and considered, and addended by me as needed.  Mitchell SorensonEva Shaw, MD MPH  Results for orders placed or performed in visit on 09/10/14  Comprehensive metabolic panel  Result Value Ref Range   Sodium 139 135 - 145 mEq/L   Potassium 4.2 3.5 - 5.3 mEq/L   Chloride 103 96 - 112 mEq/L   CO2 30 19 - 32  mEq/L   Glucose, Bld 109 (H) 70 - 99 mg/dL   BUN 15 6 - 23 mg/dL   Creat 1.611.24 0.960.50 - 0.451.35 mg/dL   Total Bilirubin 0.5 0.2 - 1.2 mg/dL   Alkaline Phosphatase 75 39 - 117 U/L   AST 32 0 - 37 U/L   ALT 40 0 - 53 U/L   Total Protein 7.9 6.0 - 8.3 g/dL   Albumin 4.5 3.5 - 5.2 g/dL   Calcium 9.5 8.4 - 40.910.5 mg/dL  TSH  Result Value Ref  Range   TSH 1.072 0.350 - 4.500 uIU/mL  HIV antibody  Result Value Ref Range   HIV 1&2 Ab, 4th Generation NONREACTIVE NONREACTIVE  RPR  Result Value Ref Range   RPR NON REAC NON REAC  Hepatitis C antibody  Result Value Ref Range   HCV Ab NEGATIVE NEGATIVE  POCT CBC  Result Value Ref Range   WBC 4.4 (A) 4.6 - 10.2 K/uL   Lymph, poc 2.4 0.6 - 3.4   POC LYMPH PERCENT 54.2 (A) 10 - 50 %L   MID (cbc) 0.3 0 - 0.9   POC MID % 6.5 0 - 12 %M   POC Granulocyte 1.7 (A) 2 - 6.9   Granulocyte percent 39.3 37 - 80 %G   RBC 4.72 4.69 - 6.13 M/uL   Hemoglobin 14.8 14.1 - 18.1 g/dL   HCT, POC 40.945.4 81.143.5 - 53.7 %   MCV 96.1 80 - 97 fL   MCH, POC 31.3 (A) 27 - 31.2 pg   MCHC 32.6 31.8 - 35.4 g/dL   RDW, POC 91.413.7 %   Platelet Count, POC 243 142 - 424 K/uL   MPV 8.2 0 - 99.8 fL  POCT UA - Microscopic Only  Result Value Ref Range   WBC, Ur, HPF, POC 0-4    RBC, urine, microscopic 0-2    Bacteria, U Microscopic neg    Mucus, UA positive    Epithelial cells, urine per micros 0-1    Crystals, Ur, HPF, POC neg    Casts, Ur, LPF, POC neg    Yeast, UA neg   POCT urinalysis dipstick  Result Value Ref Range   Color, UA dark yellow    Clarity, UA clear    Glucose, UA neg    Bilirubin, UA neg    Ketones, UA neg    Spec Grav, UA 1.025    Blood, UA neg    pH, UA 6.0    Protein, UA neg    Urobilinogen, UA 0.2    Nitrite, UA neg    Leukocytes, UA Negative   POCT glycosylated hemoglobin (Hb A1C)  Result Value Ref Range   Hemoglobin A1C 6.3

## 2014-09-10 NOTE — Patient Instructions (Addendum)
PLease come into clinic on Thursday morning for a LAB ONLY visit - you do not need to see the doctor so this should be fasting. Try not to urinate before you come in and nothing to eat or drink prior to your visit.  Keeping you healthy  Get these tests  Blood pressure- Have your blood pressure checked once a year by your healthcare provider.  Normal blood pressure is 120/80.  Weight- Have your body mass index (BMI) calculated to screen for obesity.  BMI is a measure of body fat based on height and weight. You can also calculate your own BMI at https://www.west-esparza.com/.  Cholesterol- Have your cholesterol checked regularly starting at age 60, sooner may be necessary if you have diabetes, high blood pressure, if a family member developed heart diseases at an early age or if you smoke.   Chlamydia, HIV, and other sexual transmitted disease- Get screened each year until the age of 63 then within three months of each new sexual partner.  Diabetes- Have your blood sugar checked regularly if you have high blood pressure, high cholesterol, a family history of diabetes or if you are overweight.  Get these vaccines  Flu shot- Every fall.  Tetanus shot- Every 10 years.  Menactra- Single dose; prevents meningitis.  Take these steps  Don't smoke- If you do smoke, ask your healthcare provider about quitting. For tips on how to quit, go to www.smokefree.gov or call 1-800-QUIT-NOW.  Be physically active- Exercise 5 days a week for at least 30 minutes.  If you are not already physically active start slow and gradually work up to 30 minutes of moderate physical activity.  Examples of moderate activity include walking briskly, mowing the yard, dancing, swimming bicycling, etc.  Eat a healthy diet- Eat a variety of healthy foods such as fruits, vegetables, low fat milk, low fat cheese, yogurt, lean meats, poultry, fish, beans, tofu, etc.  For more information on healthy eating, go to  www.thenutritionsource.org  Drink alcohol in moderation- Limit alcohol intake two drinks or less a day.  Never drink and drive.  Dentist- Brush and floss teeth twice daily; visit your dentis twice a year.  Depression-Your emotional health is as important as your physical health.  If you're feeling down, losing interest in things you normally enjoy please talk with your healthcare provider.  Gun Safety- If you keep a gun in your home, keep it unloaded and with the safety lock on.  Bullets should be stored separately.  Helmet use- Always wear a helmet when riding a motorcycle, bicycle, rollerblading or skateboarding.  Safe sex- If you may be exposed to a sexually transmitted infection, use a condom  Seat belts- Seat bels can save your life; always wear one.  Smoke/Carbon Monoxide detectors- These detectors need to be installed on the appropriate level of your home.  Replace batteries at least once a year.  Skin Cancer- When out in the sun, cover up and use sunscreen SPF 15 or higher.  Violence- If anyone is threatening or hurting you, please tell your healthcare provider.  Diabetes Mellitus and Food It is important for you to manage your blood sugar (glucose) level. Your blood glucose level can be greatly affected by what you eat. Eating healthier foods in the appropriate amounts throughout the day at about the same time each day will help you control your blood glucose level. It can also help slow or prevent worsening of your diabetes mellitus. Healthy eating may even help you improve the level  of your blood pressure and reach or maintain a healthy weight.  HOW CAN FOOD AFFECT ME? Carbohydrates Carbohydrates affect your blood glucose level more than any other type of food. Your dietitian will help you determine how many carbohydrates to eat at each meal and teach you how to count carbohydrates. Counting carbohydrates is important to keep your blood glucose at a healthy level, especially if  you are using insulin or taking certain medicines for diabetes mellitus. Alcohol Alcohol can cause sudden decreases in blood glucose (hypoglycemia), especially if you use insulin or take certain medicines for diabetes mellitus. Hypoglycemia can be a life-threatening condition. Symptoms of hypoglycemia (sleepiness, dizziness, and disorientation) are similar to symptoms of having too much alcohol.  If your health care provider has given you approval to drink alcohol, do so in moderation and use the following guidelines:  Women should not have more than one drink per day, and men should not have more than two drinks per day. One drink is equal to:  12 oz of beer.  5 oz of wine.  1 oz of hard liquor.  Do not drink on an empty stomach.  Keep yourself hydrated. Have water, diet soda, or unsweetened iced tea.  Regular soda, juice, and other mixers might contain a lot of carbohydrates and should be counted. WHAT FOODS ARE NOT RECOMMENDED? As you make food choices, it is important to remember that all foods are not the same. Some foods have fewer nutrients per serving than other foods, even though they might have the same number of calories or carbohydrates. It is difficult to get your body what it needs when you eat foods with fewer nutrients. Examples of foods that you should avoid that are high in calories and carbohydrates but low in nutrients include:  Trans fats (most processed foods list trans fats on the Nutrition Facts label).  Regular soda.  Juice.  Candy.  Sweets, such as cake, pie, doughnuts, and cookies.  Fried foods. WHAT FOODS CAN I EAT? Have nutrient-rich foods, which will nourish your body and keep you healthy. The food you should eat also will depend on several factors, including:  The calories you need.  The medicines you take.  Your weight.  Your blood glucose level.  Your blood pressure level.  Your cholesterol level. You also should eat a variety of foods,  including:  Protein, such as meat, poultry, fish, tofu, nuts, and seeds (lean animal proteins are best).  Fruits.  Vegetables.  Dairy products, such as milk, cheese, and yogurt (low fat is best).  Breads, grains, pasta, cereal, rice, and beans.  Fats such as olive oil, trans fat-free margarine, canola oil, avocado, and olives. DOES EVERYONE WITH DIABETES MELLITUS HAVE THE SAME MEAL PLAN? Because every person with diabetes mellitus is different, there is not one meal plan that works for everyone. It is very important that you meet with a dietitian who will help you create a meal plan that is just right for you. Document Released: 07/23/2005 Document Revised: 10/31/2013 Document Reviewed: 09/22/2013 [facility] Patient Information 2015 [place]. This information is not intended to replace advice given to you by your health care provider. Make sure you discuss any questions you have with your health care provider. Diabetes, Eating Away From Home Sometimes, you might eat in a restaurant or have meals that are prepared by someone else. You can enjoy eating out. However, the portions in restaurants may be much larger than needed. Listed below are some ideas to help you  choose foods that will keep your blood glucose (sugar) in better control.  TIPS FOR EATING OUT  Know your meal plan and how many carbohydrate servings you should have at each meal. You may wish to carry a copy of your meal plan in your purse or wallet. Learn the foods included in each food group.  Make a list of restaurants near you that offer healthy choices. Take a copy of the carry-out menus to see what they offer. Then, you can plan what you will order ahead of time.  Become familiar with serving sizes by practicing them at home using measuring cups and spoons. Once you learn to recognize portion sizes, you will be [place] to correctly estimate the amount of total carbohydrate you are allowed to eat at the restaurant. Ask for a  takeout box if the portion is more than you should have. When your food comes, leave the amount you should have on the plate, and put the rest in the takeout box before you start eating.  Plan ahead if your mealtime will be different from usual. Check with your caregiver to find out how to time meals and medicine if you are taking insulin.  Avoid high-fat foods, such as fried foods, cream sauces, high-fat salad dressings, or any added butter or margarine.  Do not be afraid to ask questions. Ask your server about the portion size, cooking methods, ingredients and if items can be substituted. Restaurants do not list all available items on the menu. You can ask for your main entree to be prepared using skim milk, oil instead of butter or margarine, and without gravy or sauces. Ask your waiter or waitress to serve salad dressings, gravy, sauces, margarine, and sour cream on the side. You can then add the amount your meal plan suggests.  Add more vegetables whenever possible.  Avoid items that are labeled "jumbo," "giant," "deluxe," or "supersized."  You may want to split an entre with someone and order an extra side salad.  Watch for hidden calories in foods like croutons, bacon, or cheese.  Ask your server to take away the bread basket or chips from your table.  Order a dinner salad as an appetizer. You can eat most foods served in a restaurant. Some foods are better choices than others. Breads and Starches  Recommended: All kinds of bread (wheat, rye, white, oatmeal, [place], [place], raisin), hard or soft dinner rolls, frankfurter or hamburger buns, small bagels, small corn or whole-wheat flour tortillas.  Avoid: Frosted or glazed breads, butter rolls, egg or cheese breads, croissants, sweet rolls, pastries, coffee cake, glazed or frosted doughnuts, muffins. Crackers  Recommended: Animal crackers, graham, rye, saltine, oyster, and matzoth crackers. Bread sticks, melba toast, rusks,  pretzels, popcorn (without fat), zwieback toast.  Avoid: High-fat snack crackers or chips. Buttered popcorn. Cereals  Recommended: Hot and cold cereals. Whole grains such as oatmeal or shredded wheat are good choices.  Avoid: Sugar-coated or granola type cereals. Potatoes/Pasta/Rice/Beans  Recommended: Order baked, boiled, or mashed potatoes, rice or noodles without added fat, whole beans. Order gravies, butter, margarine, or sauces on the side so you can control the amount you add.  Avoid: Hash browns or fried potatoes. Potatoes, pasta, or rice prepared with cream or cheese sauce. Potato or pasta salads prepared with large amounts of dressing. Fried beans or fried rice. Vegetables  Recommended: Order steamed, baked, boiled, or stewed vegetables without sauces or extra fat. Ask that sauce be served on the side. If vegetables are not  listed on the menu, ask what is available.  Avoid: Vegetables prepared with cream, butter, or cheese sauce. Fried vegetables. Salad Bars  Recommended: Many of the vegetables at a salad bar are considered "free." Use lemon juice, vinegar, or low-calorie salad dressing (fewer than 20 calories per serving) as "free" dressings for your salad. Look for salad bar ingredients that have no added fat or sugar such as tomatoes, lettuce, cucumbers, broccoli, carrots, onions, and mushrooms.  Avoid: Prepared salads with large amounts of dressing, such as coleslaw, caesar salad, macaroni salad, bean salad, or carrot salad. Fruit  Recommended: Eat fresh fruit or fresh fruit salad without added dressing. A salad bar often offers fresh fruit choices, but canned fruit at a restaurant is usually packed in sugar or syrup.  Avoid: Sweetened canned or frozen fruits, plain or sweetened fruit juice. Fruit salads with dressing, sour cream, or sugar added to them. Meat and Meat Substitutes  Recommended: Order broiled, baked, roasted, or grilled meat, poultry, or fish. Trim off all  visible fat. Do not eat the skin of poultry. The size stated on the menu is the raw weight. Meat shrinks by  in cooking (for example, 4 oz raw equals 3 oz cooked meat).  Avoid: Deep-fat fried meat, poultry, or fish. Breaded meats. Eggs  Recommended: Order soft, hard-cooked, poached, or scrambled eggs. Omelets may be okay, depending on what ingredients are added. Egg substitutes are also a good choice.  Avoid: Fried eggs, eggs prepared with cream or cheese sauce. Milk  Recommended: Order low-fat or fat-free milk according to your meal plan. Plain, nonfat yogurt or flavored yogurt with no sugar added may be used as a substitute for milk. Soy milk may also be used.  Avoid: Milk shakes or sweetened milk beverages. Soups and Combination Foods  Recommended: Clear broth or consomm are "free" foods and may be used as an appetizer. Broth-based soups with fat removed count as a starch serving and are preferred over cream soups. Soups made with beans or split peas may be eaten but count as a starch.  Avoid: Fatty soups, soup made with cream, cheese soup. Combination foods prepared with excessive amounts of fat or with cream or cheese sauces. Desserts and Sweets  Recommended: Ask for fresh fruit. Sponge or angel food cake without icing, ice milk, no sugar added ice cream, sherbet, or frozen yogurt may fit into your meal plan occasionally.  Avoid: Pastries, puddings, pies, cakes with icing, custard, gelatin desserts. Fats and Oils  Recommended: Choose healthy fats such as olive oil, canola oil, or tub margarine, reduced fat or fat-free sour cream, cream cheese, avocado, or nuts.  Avoid: Any fats in excess of your allowed portion. Deep-fried foods or any food with a large amount of fat. Note: Ask for all fats to be served on the side, and limit your portion sizes according to your meal plan. Document Released: 10/26/2005 Document Revised: 01/18/2012 Document Reviewed: 01/23/2014 [facility] Patient  Information 2015 [place], [place]. This information is not intended to replace advice given to you by your health care provider. Make sure you discuss any questions you have with your health care provider.

## 2014-09-11 ENCOUNTER — Encounter: Payer: Self-pay | Admitting: Family Medicine

## 2014-09-12 LAB — TRICHOMONAS VAGINALIS, PROBE AMP: TRICHOMONAS VAGINALIS PROBE APTIMA: NEGATIVE

## 2014-09-13 ENCOUNTER — Other Ambulatory Visit (INDEPENDENT_AMBULATORY_CARE_PROVIDER_SITE_OTHER): Payer: BC Managed Care – PPO | Admitting: Radiology

## 2014-09-13 DIAGNOSIS — Z113 Encounter for screening for infections with a predominantly sexual mode of transmission: Secondary | ICD-10-CM

## 2014-09-13 DIAGNOSIS — Z13220 Encounter for screening for lipoid disorders: Secondary | ICD-10-CM

## 2014-09-13 LAB — LIPID PANEL
CHOL/HDL RATIO: 3.7 ratio
CHOLESTEROL: 175 mg/dL (ref 0–200)
HDL: 47 mg/dL (ref >39)
LDL Cholesterol: 111 mg/dL — ABNORMAL HIGH (ref 0–99)
TRIGLYCERIDES: 87 mg/dL (ref <150)
VLDL: 17 mg/dL (ref 0–40)

## 2014-09-13 NOTE — Progress Notes (Signed)
Labwork only  Lipid, uriprobe-- collected and sent

## 2014-09-15 LAB — GC/CHLAMYDIA PROBE AMP
CT Probe RNA: NEGATIVE
GC PROBE AMP APTIMA: NEGATIVE

## 2014-09-26 ENCOUNTER — Encounter: Payer: Self-pay | Admitting: Family Medicine

## 2014-10-31 ENCOUNTER — Ambulatory Visit (INDEPENDENT_AMBULATORY_CARE_PROVIDER_SITE_OTHER): Payer: Self-pay | Admitting: Emergency Medicine

## 2014-10-31 ENCOUNTER — Telehealth: Payer: Self-pay

## 2014-10-31 VITALS — BP 122/80 | HR 65 | Temp 97.3°F | Resp 18 | Ht 71.25 in | Wt 179.6 lb

## 2014-10-31 DIAGNOSIS — Z0000 Encounter for general adult medical examination without abnormal findings: Secondary | ICD-10-CM

## 2014-10-31 DIAGNOSIS — Z024 Encounter for examination for driving license: Secondary | ICD-10-CM

## 2014-10-31 NOTE — Telephone Encounter (Signed)
Pt came to office and message was given

## 2014-10-31 NOTE — Progress Notes (Signed)
° °  Subjective:    Patient ID: Mitchell Schmidt, male    DOB: [birth year], 38 y.o.   MRN: [id number] This chart was scribed for Lesle ChrisSteven Daub, MD by Jolene Provostobert Halas, Medical Scribe. This patient was seen in Room 8 and the patient's care was started at 12:36 PM.  Chief Complaint  Patient presents with   DOT Physical    HPI HPI Comments: Mitchell Schmidt is a 38 y.o. male with a hx of prediabetes and insomnia who [facility]. Pt states he takes Ambien 2-3 times per week to sleep. Pt states he is careful not to take Ambien the night before he has to drive.   Review of Systems  Constitutional: Negative for fever and chills.  HENT: Negative for congestion.   Respiratory: Negative for chest tightness and shortness of breath.   Cardiovascular: Negative for chest pain.  Gastrointestinal: Negative for abdominal pain.       Objective:   Physical Exam  Constitutional: He is oriented to person, place, and time. He appears well-developed and well-nourished.  HENT:  Head: Normocephalic and atraumatic.  Eyes: Pupils are equal, round, and reactive to light.  Neck: Neck supple. No thyromegaly present.  Cardiovascular: Normal rate and regular rhythm.   Pulmonary/Chest: Effort normal and breath sounds normal. No respiratory distress.  Genitourinary: Penis normal. No penile tenderness.  Musculoskeletal: Normal range of motion. He exhibits no edema or tenderness.  Lymphadenopathy:    He has no cervical adenopathy.  Neurological: He is alert and oriented to person, place, and time.  Skin: Skin is warm and dry.  Psychiatric: He has a normal mood and affect. His behavior is normal.  Nursing note and vitals reviewed.      Assessment & Plan:   Patient cautioned not to use the Ambien while driving. He understands this restriction. 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 The recorded  information has been reviewed and is accurate.

## 2014-10-31 NOTE — Telephone Encounter (Signed)
Spoke to pharmacy- pt did not contact them for a refill. Pt needs to contact them for this to be ready for pick up.  Tried to call pt back- phone picked up and music played. No Answer.

## 2014-10-31 NOTE — Telephone Encounter (Signed)
Patient is requesting a refill for ambiem, he says he has five refills. Pharmacy wont refill till he calls our office per patient. Please advise.

## 2014-11-05 ENCOUNTER — Telehealth: Payer: Self-pay | Admitting: *Deleted

## 2014-11-05 NOTE — Telephone Encounter (Signed)
Patient called regarding [employer] form for Ambien. He was notified Dr. Clelia CroftShaw filled out form and we faxed it back to number listed on bottom of form [phone]. He requested to pick up copy of form tomorrow am. Copy made and left up front for him to p/u. Copy made for his chart and was sent to scan.

## 2014-11-06 ENCOUNTER — Telehealth: Payer: Self-pay

## 2014-11-06 NOTE — Telephone Encounter (Signed)
Pt wanted insomnia removed from his medical history- advised pt that he uses Ambien to help with his sleeping. Pt confirmed. Advised him we can not take out his medical history.

## 2014-11-06 NOTE — Telephone Encounter (Signed)
Pt would like to speak with someone regarding something that is in his chart he would like to have taken out, please call [phone]

## 2015-02-05 ENCOUNTER — Ambulatory Visit: Payer: 59

## 2015-02-22 ENCOUNTER — Ambulatory Visit (INDEPENDENT_AMBULATORY_CARE_PROVIDER_SITE_OTHER): Payer: 59 | Admitting: Family Medicine

## 2015-02-22 ENCOUNTER — Ambulatory Visit (INDEPENDENT_AMBULATORY_CARE_PROVIDER_SITE_OTHER): Payer: 59

## 2015-02-22 VITALS — BP 110/80 | HR 70 | Temp 97.6°F | Resp 18 | Ht 70.0 in | Wt 175.4 lb

## 2015-02-22 DIAGNOSIS — L659 Nonscarring hair loss, unspecified: Secondary | ICD-10-CM

## 2015-02-22 DIAGNOSIS — K219 Gastro-esophageal reflux disease without esophagitis: Secondary | ICD-10-CM

## 2015-02-22 DIAGNOSIS — M25521 Pain in right elbow: Secondary | ICD-10-CM

## 2015-02-22 DIAGNOSIS — L03011 Cellulitis of right finger: Secondary | ICD-10-CM

## 2015-02-22 LAB — POCT SKIN KOH: Skin KOH, POC: NEGATIVE

## 2015-02-22 MED ORDER — RANITIDINE HCL 150 MG PO TABS: 150.0000 mg | ORAL_TABLET | Freq: Two times a day (BID) | ORAL | Status: DC

## 2015-02-22 MED ORDER — DOXYCYCLINE HYCLATE 100 MG PO CAPS: 100.0000 mg | ORAL_CAPSULE | Freq: Two times a day (BID) | ORAL | Status: DC

## 2015-02-22 NOTE — Progress Notes (Signed)
Xray read and patient discussed with Mitchell Schmidt. Agree with assessment and plan of care per his note.   

## 2015-02-22 NOTE — Progress Notes (Signed)
Verbal Consent Obtained. Digital block with 4 cc of 1% lidocaine plain.  11 blade used to incise the lesion centrally.  Copious purulence expressed. Irrigated wound with 2 cc of 2% lidocaine.  Cleansed and dressed.

## 2015-02-22 NOTE — Progress Notes (Signed)
Subjective:    Patient ID: Mitchell Schmidt, male    DOB: 24-Oct-1976, 39 y.o.   MRN: [id number]  Chief Complaint  Patient presents with  . Arm Pain    Rt arm muscle pain -NKI- x 4 days  . Hand Pain    Rt 4-th finger infection x 4 days  . Start loosing hair    was on omeprazole and 1 1/2 month later start loosing hair   Patient Active Problem List   Diagnosis Date Noted  . Insomnia 09/10/2014  . Prediabetes 09/10/2014   Medications, allergies, past medical history, surgical history, family history, social history and problem list reviewed and updated.  HPI  39 yom with no pertinent pmh presents for above issues.   Losing hair - Started approx 2 months. Has slowly developed few small patches of hair loss over scalp. Denies itchiness. Denies hair loss elsewhere. Denies rash, fevers, chills, fatigue. No new foods. No new stressors. Started omeprazole 5 months ago otherwise no new meds.   Right finger pain - Awoke 5 days ago with tender, swollen distal right 4th digit. Has been persistent since then. Describes pain as worst at proximal nail line. Rated as mild. No pain at tip of finger or over nail. Denies trauma. Denies drainage. Works as [employer] architect. No water exposure. No outdoor exposures.   Right elbow pain - 3-4 days ago started having right elbow pain. Both medial and lateral aspects. Fairly sudden onset at presentation. No trauma. Has been constant, rated as moderate. Pain does not worsen with movement. Denies new activities or motions with right arm.   Review of Systems No fevers, chills. See HPI.     Objective:   Physical Exam  Constitutional: He is oriented to person, place, and time. He appears well-developed and well-nourished.  Non-toxic appearance. He does not have a sickly appearance. He does not appear ill. No distress.  BP 110/80 mmHg  Pulse 70  Temp(Src) 97.6 F (36.4 C) (Oral)  Resp 18  Ht  (1.778 m)  Wt 175 lb 6.4 oz (79.561 kg)  BMI 25.17 kg/m2  SpO2  99%   HENT:  Head: Hair is abnormal.  4-5 patches thinned hair. No overlying erythema or scale.   Musculoskeletal:       Right shoulder: Normal.       Right elbow: He exhibits normal range of motion and no swelling. Tenderness found. No radial head, no medial epicondyle, no lateral epicondyle and no olecranon process tenderness noted.       Right wrist: Normal.       Hands: Right elbow with full rom. No pain with movement. No bony ttp. TTP over lateral musculature. TTP over medial musculature. No epicondyle ttp. No pain with resisted wrist flexion or extension. No crepitus. Normal strength. Normal sensation.   Fluctuant swelling over proximal and lateral nail fold. No apparent nail involvement. Circumferential distal digit. No ttp/ulceration/erythema over fat pad. Normal cap refill. Normal strength. Full rom.   Neurological: He is alert and oriented to person, place, and time.  Psychiatric: He has a normal mood and affect. His speech is normal and behavior is normal.   [facility] reading (PRIMARY) by  Dr. Neva Seat. Findings: Possible trace anterior fat pad, no posterior fat pad. No apparent fracture.   Results for orders placed or performed in visit on 02/22/15  POCT Skin KOH  Result Value Ref Range   Skin KOH, POC Negative       Assessment & Plan:  39 yom with no pertinent pmh presents for above issues.   Bilateral elbow joint pain, right - Plan: DG Elbow Complete Right --normal xray, no swelling, no bony ttp --no epicondyle ttp or pain with wrist extension/flexion to suggest epicondylitis --ttp over musculature near elbow, possible muscular strain assoc with work --rtc 1-2 wks if no relief with prn tylenol  Patchy loss of hair - Plan: POCT Skin KOH Gastroesophageal reflux disease, esophagitis presence not specified - Plan: ranitidine (ZANTAC) 150 MG tablet --scrape neg for fungal etiology --could be secondary to omeprazole as pt started this 4 months ago and hair loss started 2  months --dc omeprazole, start zantac bid for gerd --rtc if no improvement or worsening hair loss despite these measures, likely further wu at that time  Paronychia of finger of right hand - Plan: Wound culture, doxycycline (VIBRAMYCIN) 100 MG capsule --doubt felon with no fatpad ttp, erythema, ulceration --I&D of paronychia with copious purulent dc expressed --cx sent --doxy bid 10 days, cx pending  Donnajean Lopesodd M. Krishauna Schatzman, PA-C Physician Assistant-Certified [facility]  02/22/2015 5:12 PM

## 2015-02-22 NOTE — Patient Instructions (Signed)
Your finger had a paronychia as below. Please keep the dressing for for 24 hours. At that point you can take it off and clean it as normal. Please soak your finger in warm soaks 3 times per day for next week. Please take the antibiotic twice daily for 10 days. Your elbow xray and exam was normal today. Please let us know if your elbow does not feel better in 1-2 weeks. Taking tylenol every 4 hours as needed for the pain may help. Please stop taking the omeprazole. I have sent in zantac to take twice daily for reflux. Please let us know if the hair loss doesn't get better. We checked it for a fungal infection today which was negative.    Paronychia Paronychia is an inflammatory reaction involving the folds of the skin surrounding the fingernail. This is commonly caused by an infection in the skin around a nail. The most common cause of paronychia is frequent wetting of the hands (as seen with bartenders, food servers, nurses or others who wet their hands). This makes the skin around the fingernail susceptible to infection by bacteria (germs) or fungus. Other predisposing factors are:  Aggressive manicuring.  Nail biting.  Thumb sucking. The most common cause is a staphylococcal (a type of germ) infection, or a fungal (Candida) infection. When caused by a germ, it usually comes on suddenly with redness, swelling, pus and is often painful. It may get under the nail and form an abscess (collection of pus), or form an abscess around the nail. If the nail itself is infected with a fungus, the treatment is usually prolonged and may require oral medicine for up to one year. Your caregiver will determine the length of time treatment is required. The paronychia caused by bacteria (germs) may largely be avoided by not pulling on hangnails or picking at cuticles. When the infection occurs at the tips of the finger it is called felon. When the cause of paronychia is from the herpes simplex virus (HSV) it is called  herpetic whitlow. TREATMENT  When an abscess is present treatment is often incision and drainage. This means that the abscess must be cut open so the pus can get out. When this is done, the following home care instructions should be followed. HOME CARE INSTRUCTIONS   It is important to keep the affected fingers very dry. Rubber or plastic gloves over cotton gloves should be used whenever the hand must be placed in water.  Keep wound clean, dry and dressed as suggested by your caregiver between warm soaks or warm compresses.  Soak in warm water for fifteen to twenty minutes three to four times per day for bacterial infections. Fungal infections are very difficult to treat, so often require treatment for long periods of time.  For bacterial (germ) infections take antibiotics (medicine which kill germs) as directed and finish the prescription, even if the problem appears to be solved before the medicine is gone.  Only take over-the-counter or prescription medicines for pain, discomfort, or fever as directed by your caregiver. SEEK IMMEDIATE MEDICAL CARE IF:  You have redness, swelling, or increasing pain in the wound.  You notice pus coming from the wound.  You have a fever.  You notice a bad smell coming from the wound or dressing. Document Released: 04/21/2001 Document Revised: 01/18/2012 Document Reviewed: 12/21/2008 [facility] Patient Information 2015 [place]. This information is not intended to replace advice given to you by your health care provider. Make sure you discuss any questions you  have with your health care provider.  

## 2015-02-25 LAB — WOUND CULTURE
Gram Stain: NONE SEEN
Gram Stain: NONE SEEN

## 2015-08-21 ENCOUNTER — Ambulatory Visit (INDEPENDENT_AMBULATORY_CARE_PROVIDER_SITE_OTHER): Payer: 59 | Admitting: Emergency Medicine

## 2015-08-21 ENCOUNTER — Ambulatory Visit (INDEPENDENT_AMBULATORY_CARE_PROVIDER_SITE_OTHER): Payer: 59

## 2015-08-21 VITALS — BP 118/82 | HR 68 | Temp 98.3°F | Resp 16 | Ht 70.0 in | Wt 172.0 lb

## 2015-08-21 DIAGNOSIS — K625 Hemorrhage of anus and rectum: Secondary | ICD-10-CM

## 2015-08-21 DIAGNOSIS — M549 Dorsalgia, unspecified: Secondary | ICD-10-CM | POA: Diagnosis not present

## 2015-08-21 LAB — POC HEMOCCULT BLD/STL (OFFICE/1-CARD/DIAGNOSTIC): Fecal Occult Blood, POC: NEGATIVE

## 2015-08-21 MED ORDER — HYDROCORTISONE 1 % RE CREA: TOPICAL_CREAM | RECTAL | Status: DC

## 2015-08-21 MED ORDER — MELOXICAM 15 MG PO TABS: 15.0000 mg | ORAL_TABLET | Freq: Every day | ORAL | Status: DC

## 2015-08-21 NOTE — Progress Notes (Addendum)
Patient ID: Mitchell Schmidt, male   DOB: [birth year], 39 y.o.   MRN: [id number]    By signing my name below, I, Essence Howell, attest that this documentation has been prepared under the direction and in the presence of Collene Gobble, MD Electronically Signed: Charline Bills, ED Scribe 08/21/2015 at 9:00 AM.  Chief Complaint:  Chief Complaint  Patient presents with  . Back Pain    Left hip area onset 1-2 months  . Blood when have bowel movement    X4days   HPI: Mitchell Schmidt is a 39 y.o. male who reports to [facility] today complaining of blood in stools 4 days ago. Pt states that he strained during a BM 4 days ago and noticed red blood in his stools. He denies history of the same or hemorrhoids. No recent heavy lifting. Pt also denies melena or rectal pain.   Left Hip Pain Pt also presents with gradually improving, constant left hip pain for the past 1-2 months. Pain is exacerbated with movement and palpation.   Past Medical History  Diagnosis Date  . Allergy   . Pre-diabetes   . Elevated LFTs    No past surgical history on file. Social History   Social History  . Marital Status: Married    Spouse Name: N/A  . Number of Children: N/A  . Years of Education: N/A   Social History Main Topics  . Smoking status: Never Smoker   . Smokeless tobacco: None  . Alcohol Use: 0.6 - 1.2 oz/week    1-2 Standard drinks or equivalent per week  . Drug Use: No  . Sexual Activity: Not Asked   Other Topics Concern  . None   Social History Narrative   No family history on file. No Known Allergies Prior to Admission medications   Medication Sig Start Date End Date Taking? Authorizing Provider  ranitidine (ZANTAC) 150 MG tablet Take 1 tablet (150 mg total) by mouth 2 (two) times daily. Patient not taking: Reported on 08/21/2015 02/22/15   Raelyn Ensign, PA     ROS: The patient denies fevers, chills, night sweats, unintentional weight loss, chest pain, palpitations, wheezing, dyspnea on  exertion, nausea, vomiting, abdominal pain, dysuria, hematuria, melena, rectal pain, numbness, weakness, or tingling. +arthralgias, +blood in stools  All other systems have been reviewed and were otherwise negative with the exception of those mentioned in the HPI and as above.    PHYSICAL EXAM: Filed Vitals:   08/21/15 0835  BP: 118/82  Pulse: 68  Temp: 98.3 F (36.8 C)  Resp: 16   Body mass index is 24.68 kg/(m^2).   General: Alert, no acute distress HEENT:  Normocephalic, atraumatic, oropharynx patent. Eye: Mitchell Schmidt Cardiovascular:  Regular rate and rhythm, no rubs murmurs or gallops. No Carotid bruits, radial pulse intact. No pedal edema.  Respiratory: Clear to auscultation bilaterally. No wheezes, rales, or rhonchi. No cyanosis, no use of accessory musculature Abdominal: No organomegaly, abdomen is soft and non-tender, positive bowel sounds. No masses. Musculoskeletal: Gait intact. No edema. Mild tenderness L5S1 on the L. GU: No hemorrhoids or rectal masses. Stool was brown. Skin: No rashes. Neurologic: Facial musculature symmetric. Psychiatric: Patient acts appropriately throughout our interaction. Lymphatic: No cervical or submandibular lymphadenopathy  LABS: Results for orders placed or performed in visit on 08/21/15  POC Hemoccult Bld/Stl (1-Cd Office Dx)  Result Value Ref Range   Card #1 Date     Fecal Occult Blood, POC Negative Negative   EKG/XRAY:   [facility]. He appears to be a minimal spondylolisthesis L5-S1.   ASSESSMENT/PLAN:  We'll treat with Mobic one a day. He was given Anusol HC cream to use to his. No area. He has a minimal spondylolisthesis. Hopefully this will respond to rest and anti-inflammatories.I personally performed the services described in this documentation, which was scribed in my presence. The recorded information has been reviewed and is accurate.   Gross sideeffects, risk and benefits, and  alternatives of medications d/w patient. Patient is aware that all medications have potential sideeffects and we are unable to predict every sideeffect or drug-drug interaction that may occur.  Lesle ChrisSteven Jaquawn Saffran MD 08/21/2015 8:52 AM

## 2015-08-21 NOTE — Patient Instructions (Signed)

## 2015-08-22 ENCOUNTER — Ambulatory Visit (INDEPENDENT_AMBULATORY_CARE_PROVIDER_SITE_OTHER): Payer: Self-pay | Admitting: Physician Assistant

## 2015-08-22 VITALS — BP 122/80 | HR 75 | Temp 98.4°F | Resp 18 | Ht 70.25 in | Wt 171.4 lb

## 2015-08-22 DIAGNOSIS — Z021 Encounter for pre-employment examination: Secondary | ICD-10-CM

## 2015-08-22 DIAGNOSIS — Z0289 Encounter for other administrative examinations: Secondary | ICD-10-CM

## 2015-08-22 NOTE — Progress Notes (Signed)
Airline pilot Medical Examination   Mitchell Schmidt is a 39 y.o. male who presents today for a commercial driver fitness determination physical exam. The patient reports no problems. The following portions of the patient's history were reviewed and updated as appropriate: allergies, current medications, past medical history, past surgical history and problem list. Review of Systems Review of Systems  Constitutional: Negative for fever and chills.  Eyes: Negative for blurred vision and double vision.  Respiratory: Negative for shortness of breath.   Cardiovascular: Negative for chest pain.  Neurological: Negative for focal weakness and headaches.     Objective:    Vision/hearing:  Visual Acuity Screening   Right eye Left eye Both eyes  Without correction:  With correction:     Comments: Peripheral Vision: Right eye 85 degrees. Left eye 85 degrees. The patient can distinguish the colors red, amber and green.  Hearing Screening Comments: The patient was Kumar to hear a forced whisper from 10 feet.  Applicant can recognize and distinguish among traffic control signals and devices showing standard red, green, and amber colors.  Corrective lenses required: No  Monocular Vision?: No  Hearing aid requirement: No  Physical Exam  Constitutional: He is oriented to person, place, and time. He appears well-developed and well-nourished.  HENT:  Head: Normocephalic and atraumatic.  Right Ear: Hearing, tympanic membrane, external ear and ear canal normal.  Left Ear: Hearing, tympanic membrane, external ear and ear canal normal.  Nose: Nose normal.  Mouth/Throat: Uvula is midline, oropharynx is clear and moist and mucous membranes are normal.  Eyes: Conjunctivae, EOM and lids are normal. Pupils are equal, round, and reactive to light. Right eye exhibits no discharge. Left eye exhibits no discharge. No scleral icterus.  Neck: Trachea normal and normal range of motion.  Neck supple. Carotid bruit is not present.  Cardiovascular: Normal rate, regular rhythm, normal heart sounds, intact distal pulses and normal pulses.   No murmur heard. Pulmonary/Chest: Effort normal and breath sounds normal. No respiratory distress. He has no wheezes. He has no rhonchi. He has no rales.  Abdominal: Soft. Normal appearance and bowel sounds are normal. He exhibits no abdominal bruit. There is no tenderness.  Musculoskeletal: Normal range of motion. He exhibits no edema or tenderness.  Lymphadenopathy:       Head (right side): No submental, no submandibular, no tonsillar, no preauricular, no posterior auricular and no occipital adenopathy present.       Head (left side): No submental, no submandibular, no tonsillar, no preauricular, no posterior auricular and no occipital adenopathy present.    He has no cervical adenopathy.  Neurological: He is alert and oriented to person, place, and time. He has normal strength and normal reflexes. No cranial nerve deficit or sensory deficit. Coordination and gait normal.  Skin: Skin is warm, dry and intact. No lesion and no rash noted.  Psychiatric: He has a normal mood and affect. His speech is normal and behavior is normal. Judgment and thought content normal.    BP 122/80 mmHg  Pulse 75  Temp(Src) 98.4 F (36.9 C) (Oral)  Resp 18  Ht 5' 10.25" (1.784 m)  Wt 171 lb 6.4 oz (77.747 kg)  BMI 24.43 kg/m2  SpO2 98%  Labs: Comments: sp gr: 1.015 blo: neg pro: neg glu: neg  Assessment:    Healthy male exam.  Meets standards in 33 CFR 391.41;  qualifies for 2 year certificate.    Plan:    Medical examiners certificate completed and  printed. Return as needed.

## 2015-08-28 NOTE — Progress Notes (Signed)
  Medical screening examination/treatment/procedure(s) were performed by non-physician practitioner and as supervising physician I was immediately available for consultation/collaboration.     

## 2016-03-13 ENCOUNTER — Ambulatory Visit (INDEPENDENT_AMBULATORY_CARE_PROVIDER_SITE_OTHER): Payer: BLUE CROSS/BLUE SHIELD | Admitting: Family Medicine

## 2016-03-13 VITALS — BP 108/78 | HR 61 | Temp 98.6°F | Resp 16 | Ht 70.0 in | Wt 166.0 lb

## 2016-03-13 DIAGNOSIS — Z0000 Encounter for general adult medical examination without abnormal findings: Secondary | ICD-10-CM | POA: Diagnosis not present

## 2016-03-13 DIAGNOSIS — R5383 Other fatigue: Secondary | ICD-10-CM | POA: Diagnosis not present

## 2016-03-13 DIAGNOSIS — G44209 Tension-type headache, unspecified, not intractable: Secondary | ICD-10-CM | POA: Diagnosis not present

## 2016-03-13 DIAGNOSIS — R7303 Prediabetes: Secondary | ICD-10-CM | POA: Diagnosis not present

## 2016-03-13 LAB — POCT URINALYSIS DIP (MANUAL ENTRY)
Bilirubin, UA: NEGATIVE
Glucose, UA: NEGATIVE
Ketones, POC UA: NEGATIVE
Leukocytes, UA: NEGATIVE
Nitrite, UA: NEGATIVE
Protein Ur, POC: NEGATIVE
Spec Grav, UA: <=1.005
Urobilinogen, UA: 0.2
pH, UA: 7

## 2016-03-13 LAB — COMPLETE METABOLIC PANEL WITH GFR
ALT: 41 U/L (ref 9–46)
AST: 28 U/L (ref 10–40)
Albumin: 4.7 g/dL (ref 3.6–5.1)
Alkaline Phosphatase: 74 U/L (ref 40–115)
BUN: 10 mg/dL (ref 7–25)
CO2: 29 mmol/L (ref 20–31)
Calcium: 9.6 mg/dL (ref 8.6–10.3)
Chloride: 102 mmol/L (ref 98–110)
Creat: 1.12 mg/dL (ref 0.60–1.35)
GFR, Est African American: >89 mL/min (ref >=60)
GFR, Est Non African American: 81 mL/min (ref >=60)
Glucose, Bld: 102 mg/dL — ABNORMAL HIGH (ref 65–99)
Potassium: 4.3 mmol/L (ref 3.5–5.3)
Sodium: 140 mmol/L (ref 135–146)
Total Bilirubin: 0.6 mg/dL (ref 0.2–1.2)
Total Protein: 7.8 g/dL (ref 6.1–8.1)

## 2016-03-13 LAB — POCT CBC
Granulocyte percent: 45 %G (ref 37–80)
HCT, POC: 40.3 % — AB (ref 43.5–53.7)
Hemoglobin: 14.3 g/dL (ref 14.1–18.1)
Lymph, poc: 2.2 (ref 0.6–3.4)
MCH, POC: 32.7 pg — AB (ref 27–31.2)
MCHC: 35.6 g/dL — AB (ref 31.8–35.4)
MCV: 91.9 fL (ref 80–97)
MID (cbc): 0.4 (ref 0–0.9)
MPV: 7.6 fL (ref 0–99.8)
POC Granulocyte: 2.1 (ref 2–6.9)
POC LYMPH PERCENT: 45.8 %L (ref 10–50)
POC MID %: 9.2 %M (ref 0–12)
Platelet Count, POC: 237 10*3/uL (ref 142–424)
RBC: 4.38 M/uL — AB (ref 4.69–6.13)
RDW, POC: 13.4 %
WBC: 4.7 10*3/uL (ref 4.6–10.2)

## 2016-03-13 LAB — LIPID PANEL
Cholesterol: 171 mg/dL (ref 125–200)
HDL: 59 mg/dL (ref >=40)
LDL Cholesterol: 92 mg/dL (ref <130)
Total CHOL/HDL Ratio: 2.9 Ratio (ref <=5.0)
Triglycerides: 98 mg/dL (ref <150)
VLDL: 20 mg/dL (ref <30)

## 2016-03-13 LAB — TSH: TSH: 1.43 mIU/L (ref 0.40–4.50)

## 2016-03-13 MED ORDER — BUTALBITAL-ASPIRIN-CAFFEINE 50-325-40 MG PO CAPS: 1.0000 | ORAL_CAPSULE | Freq: Four times a day (QID) | ORAL | Status: DC | PRN

## 2016-03-13 NOTE — Progress Notes (Signed)
Patient ID: Costella Hatcherble Stave MRN: [id number], DOB: [birth year] 40 y.o. Date of Encounter: 03/13/2016, 9:22 AM  Primary Physician: Tally DueGUEST, CHRIS WARREN, MD  Chief Complaint: Physical (CPE)  HPI: 40 y.o. y/o male with history noted below here for CPE.  Doing well. Exercise once a week Has a good dentist Drives truck, married, 2 daughters He is originally from [place]  C/o fatigue x 2 months.  Sleeping ok Headache about 7 days.  Not responding to Tylenol.  Located on top of head. No nausea or vision changes.  Began after exercise last Saturday.  Review of Systems: Consitutional: No fever, chills, night sweats, lymphadenopathy, or weight changes. Eyes: No visual changes, eye redness, or discharge. ENT/Mouth: Ears: No otalgia, tinnitus, hearing loss, discharge. Nose: No congestion, rhinorrhea, sinus pain, or epistaxis. Throat: No sore throat, post nasal drip, or teeth pain. Cardiovascular: No CP, palpitations, diaphoresis, DOE, edema, orthopnea, PND. Respiratory: No cough, hemoptysis, SOB, or wheezing. Gastrointestinal: No anorexia, dysphagia, reflux, pain, nausea, vomiting, hematemesis, diarrhea, constipation, BRBPR, or melena. Genitourinary: No dysuria, frequency, urgency, hematuria, incontinence, nocturia, decreased urinary stream, discharge, impotence, or testicular pain/masses. Musculoskeletal: No decreased ROM, myalgias, stiffness, joint swelling, or weakness. Skin: No rash, erythema, lesion changes, pain, warmth, jaundice, or pruritis. Neurological: No  dizziness, syncope, seizures, tremors, memory loss, coordination problems, or paresthesias. Psychological: No anxiety, depression, hallucinations, SI/HI. Endocrine: No fatigue, polydipsia, polyphagia, polyuria, or known diabetes. All other systems were reviewed and are otherwise negative.  Past Medical History  Diagnosis Date  . Allergy   . Pre-diabetes   . Elevated LFTs      History reviewed. No pertinent past surgical  history.  Home Meds:  Prior to Admission medications   Medication Sig Start Date End Date Taking? Authorizing Provider  butalbital-aspirin-caffeine [facility](FIORINAL) [id number] MG capsule Take 1 capsule by mouth every 6 (six) hours as needed for headache. 03/13/16   Elvina SidleKurt Leonie Amacher, MD    Allergies: No Known Allergies  Social History   Social History  . Marital Status: Married    Spouse Name: N/A  . Number of Children: N/A  . Years of Education: N/A   Occupational History  . Not on file.   Social History Main Topics  . Smoking status: Never Smoker   . Smokeless tobacco: Not on file  . Alcohol Use: 0.6 - 1.2 oz/week    1-2 Standard drinks or equivalent per week  . Drug Use: No  . Sexual Activity: Not on file   Other Topics Concern  . Not on file   Social History Narrative    History reviewed. No pertinent family history.  Physical Exam: Blood pressure 108/78, pulse 61, temperature 98.6 F (37 C), resp. rate 16, height 5\' 10"  (1.778 m), weight 166 lb (75.297 kg), SpO2 100 %.  BP Readings from Last 3 Encounters:  03/13/16 108/78  08/22/15 122/80  08/21/15 118/82   General: Well developed, well nourished, in no acute distress. HEENT: Normocephalic, atraumatic. Conjunctiva pink, sclera non-icteric. Pupils 2 mm constricting to 1 mm, round, regular, and equally reactive to light and accomodation. EOMI.  Fundi benign   Internal auditory canal clear. TMs with good cone of light and without pathology. Nasal mucosa pink. Nares are without discharge. No sinus tenderness. Oral mucosa pink. Dentition excellent. Pharynx without exudate.    Neck: Supple. Trachea midline. No thyromegaly. Full ROM. No lymphadenopathy. Lungs: Clear to auscultation bilaterally without wheezes, rales, or rhonchi. Breathing is of normal effort and unlabored. Cardiovascular: RRR with S1 S2. No murmurs, rubs,  or gallops appreciated. Distal pulses 2+ symmetrically. No carotid or abdominal bruits Abdomen: Soft,  non-tender, non-distended with normoactive bowel sounds. No hepatosplenomegaly or masses. No rebound/guarding. No CVA tenderness. Without hernias.    Genitourinary:  circumcised male. No penile lesions. Testes descended bilaterally, and smooth without tenderness or masses.  Musculoskeletal: Full range of motion and 5/5 strength throughout. Without swelling, atrophy, tenderness, crepitus, or warmth. Extremities without clubbing, cyanosis, or edema. Calves supple. Skin: Warm and moist without erythema, ecchymosis, wounds, or rash. Neuro: A+Ox3. CN II-XII grossly intact. Moves all extremities spontaneously. Full sensation throughout. Normal gait. DTR 2+ throughout upper and lower extremities. Finger to nose intact. Psych:  Responds to questions appropriately with a normal affect.   Lab Results  Component Value Date   CHOL 175 09/13/2014   Lab Results  Component Value Date   HDL 47 09/13/2014   Lab Results  Component Value Date   LDLCALC 111* 09/13/2014   Lab Results  Component Value Date   TRIG 87 09/13/2014   Lab Results  Component Value Date   CHOLHDL 3.7 09/13/2014   No results found for: LDLDIRECT  Results for orders placed or performed in visit on 03/13/16  POCT CBC  Result Value Ref Range   WBC 4.7 4.6 - 10.2 K/uL   Lymph, poc 2.2 0.6 - 3.4   POC LYMPH PERCENT 45.8 10 - 50 %L   MID (cbc) 0.4 0 - 0.9   POC MID % 9.2 0 - 12 %M   POC Granulocyte 2.1 2 - 6.9   Granulocyte percent 45.0 37 - 80 %G   RBC 4.38 (A) 4.69 - 6.13 M/uL   Hemoglobin 14.3 14.1 - 18.1 g/dL   HCT, POC 16.1 (A) 09.6 - 53.7 %   MCV 91.9 80 - 97 fL   MCH, POC 32.7 (A) 27 - 31.2 pg   MCHC 35.6 (A) 31.8 - 35.4 g/dL   RDW, POC 04.5 %   Platelet Count, POC 237 142 - 424 K/uL   MPV 7.6 0 - 99.8 fL  POCT urinalysis dipstick  Result Value Ref Range   Color, UA yellow yellow   Clarity, UA clear clear   Glucose, UA negative negative   Bilirubin, UA negative negative   Ketones, POC UA negative negative    Spec Grav, UA <=1.005    Blood, UA small (A) negative   pH, UA 7.0    Protein Ur, POC negative negative   Urobilinogen, UA 0.2    Nitrite, UA Negative Negative   Leukocytes, UA Negative Negative     Assessment/Plan:  40 y.o. y/o healthy male here for CPE   ICD-9-CM ICD-10-CM   1. Tension-type headache, not intractable, unspecified chronicity pattern 339.10 G44.209 butalbital-aspirin-caffeine (FIORINAL) 50-325-40 MG capsule  2. Annual physical exam V70.0 Z00.00 POCT CBC     COMPLETE METABOLIC PANEL WITH GFR     Lipid panel     POCT urinalysis dipstick  3. Other fatigue 780.79 R53.83 POCT CBC     TSH  4. Prediabetes 790.29 R73.03     Signed, Elvina Sidle, MD 03/13/2016 9:22 AM

## 2016-03-13 NOTE — Patient Instructions (Addendum)

## 2016-03-16 ENCOUNTER — Telehealth: Payer: Self-pay | Admitting: Emergency Medicine

## 2016-03-16 NOTE — Telephone Encounter (Signed)
Pt aware of lab results 

## 2016-03-16 NOTE — Telephone Encounter (Signed)
-----   Message from Elvina SidleKurt Lauenstein, MD sent at 03/14/2016 10:05 AM EDT ----- Please inform patient of normal result

## 2016-05-11 ENCOUNTER — Ambulatory Visit (INDEPENDENT_AMBULATORY_CARE_PROVIDER_SITE_OTHER): Payer: BLUE CROSS/BLUE SHIELD | Admitting: Physician Assistant

## 2016-05-11 VITALS — BP 96/66 | HR 66 | Temp 98.0°F | Resp 18 | Ht 70.0 in | Wt 165.2 lb

## 2016-05-11 DIAGNOSIS — H938X2 Other specified disorders of left ear: Secondary | ICD-10-CM

## 2016-05-11 NOTE — Progress Notes (Signed)
[facility] [address], [place] [address] [phone]  Date:  05/11/2016   Name:  Mitchell Schmidt   DOB:  [birth year]   MRN:  [id number]  PCP:  Tally DueGUEST, CHRIS WARREN, MD    History of Present Illness:  Mitchell Schmidt is a 40 y.o. male patient who [facility] of swollen left ear.  Patient states he noticed this all of a sudden 2 weeks ago.  this is not painful.  There has been no drainage.  There is no change in hearing.  No fever, or congestion.  He denies any trauma to the ear.      Patient Active Problem List   Diagnosis Date Noted  . Prediabetes 09/10/2014    Past Medical History  Diagnosis Date  . Allergy   . Pre-diabetes   . Elevated LFTs     No past surgical history on file.  Social History  Substance Use Topics  . Smoking status: Never Smoker   . Smokeless tobacco: None  . Alcohol Use: 0.6 - 1.2 oz/week    1-2 Standard drinks or equivalent per week    No family history on file.  No Known Allergies  Medication list has been reviewed and updated.  No current outpatient prescriptions on file prior to visit.   No current facility-administered medications on file prior to visit.    ROS ROS otherwise unremarkable unless listed above.   Physical Examination: BP 96/66 mmHg  Pulse 66  Temp(Src) 98 F (36.7 C) (Oral)  Resp 18  Ht 5\' 10"  (1.778 m)  Wt 165 lb 3.2 oz (74.934 kg)  BMI 23.70 kg/m2  SpO2 100% Ideal Body Weight: Weight in (lb) to have BMI = 25: 173.9  Physical Exam  Constitutional: He is oriented to person, place, and time. He appears well-developed and well-nourished. No distress.  HENT:  Head: Normocephalic and atraumatic.  Right Ear: Tympanic membrane and ear canal normal.  Left Ear: Tympanic membrane and ear canal normal.  Outer auricular swelling with fluctuance.  Nontender.  No drainage.  No warmth to the ear.  Bulbous like swelling at the superior crus of the antihelix.  Eyes: Conjunctivae and EOM  are normal. Pupils are equal, round, and reactive to light.  Cardiovascular: Normal rate.   Pulmonary/Chest: Effort normal. No respiratory distress.  Neurological: He is alert and oriented to person, place, and time.  Skin: Skin is warm and dry. He is not diaphoretic.  Psychiatric: He has a normal mood and affect. His behavior is normal.    Procedure: verbal consent obtained.  Procedure site cleansed with alcohol swab.  1% Lidocaine at the procedure site.  Anesthesia achieved.  Cleansed with povidine.  11 blade utilized to place .5cm incision along the bulbous like area.  Generous yellow serous expressed.  Steri-strip placed.  Pressure dressings placed.    Assessment and Plan: Mitchell Schmidt is a 40 y.o. male who is here today for left ear swelling.  This was opened without complication.  Pressure applied to avoid swelling.  He will return in 5 days.  Ear swelling, left - Plan: WOUND CULTURE  Trena PlattStephanie Vadis Slabach, PA-C [facility] 7/3/201710:10 PM

## 2016-05-11 NOTE — Patient Instructions (Addendum)
KEEP THE DRESSING IN PLACE FOR 2 DAYS. AFTER 2 DAYS YOU MAY REMOVE THE OUTER TAPE AND GAUZE. YOU MAY LET THE SHOWER RUN OVER YOUR EAR. PAT DRY AFTER YOUR SHOWER. APPLY A BANDAID TO YOUR EAR AREA.  RETURN HERE WITH THE "FAST TRACK" CARD ON Friday, May 15, 2016.   IF you received an x-ray today, you will receive an invoice from [facility]. Please contact [facility] at [phone] with questions or concerns regarding your invoice.   IF you received labwork today, you will receive an invoice from [facility]. Please contact [facility] at [phone] with questions or concerns regarding your invoice.   Our billing staff will not be Calvin to assist you with questions regarding bills from these companies.  You will be contacted with the lab results as soon as they are available. The fastest way to get your results is to activate your My Chart account. Instructions are located on the last page of this paperwork. If you have not heard from us regarding the results in 2 weeks, please contact this office.

## 2016-05-14 LAB — WOUND CULTURE
GRAM STAIN: NONE SEEN
GRAM STAIN: NONE SEEN
Gram Stain: NONE SEEN
Organism ID, Bacteria: NO GROWTH

## 2016-08-07 IMAGING — CR DG LUMBAR SPINE 2-3V
3 series · 3 of 3 positions shown · non-contrast
Comparison: March 04, 2007.

CLINICAL DATA: Lower back pain without sciatica without known
injury.

EXAM:
LUMBAR SPINE - 2-3 VIEW

[AP (1 of 2)]
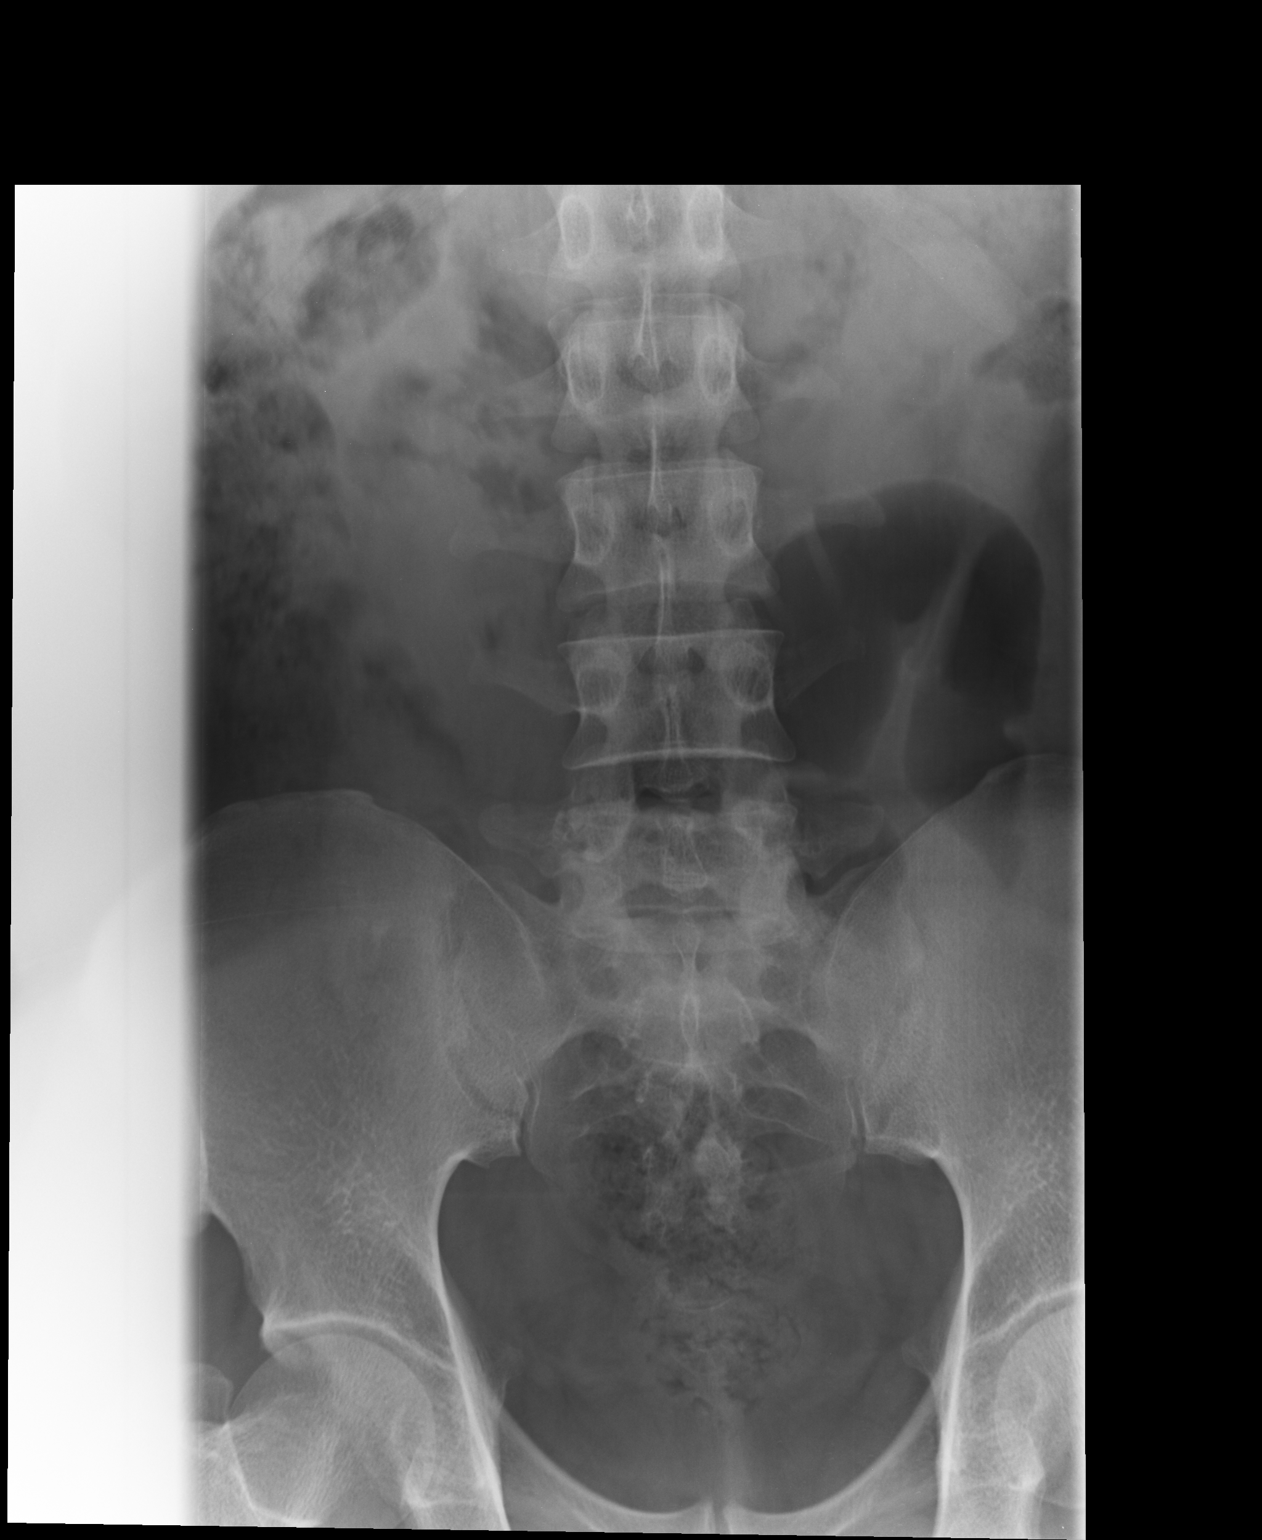

[AP (2 of 2)]
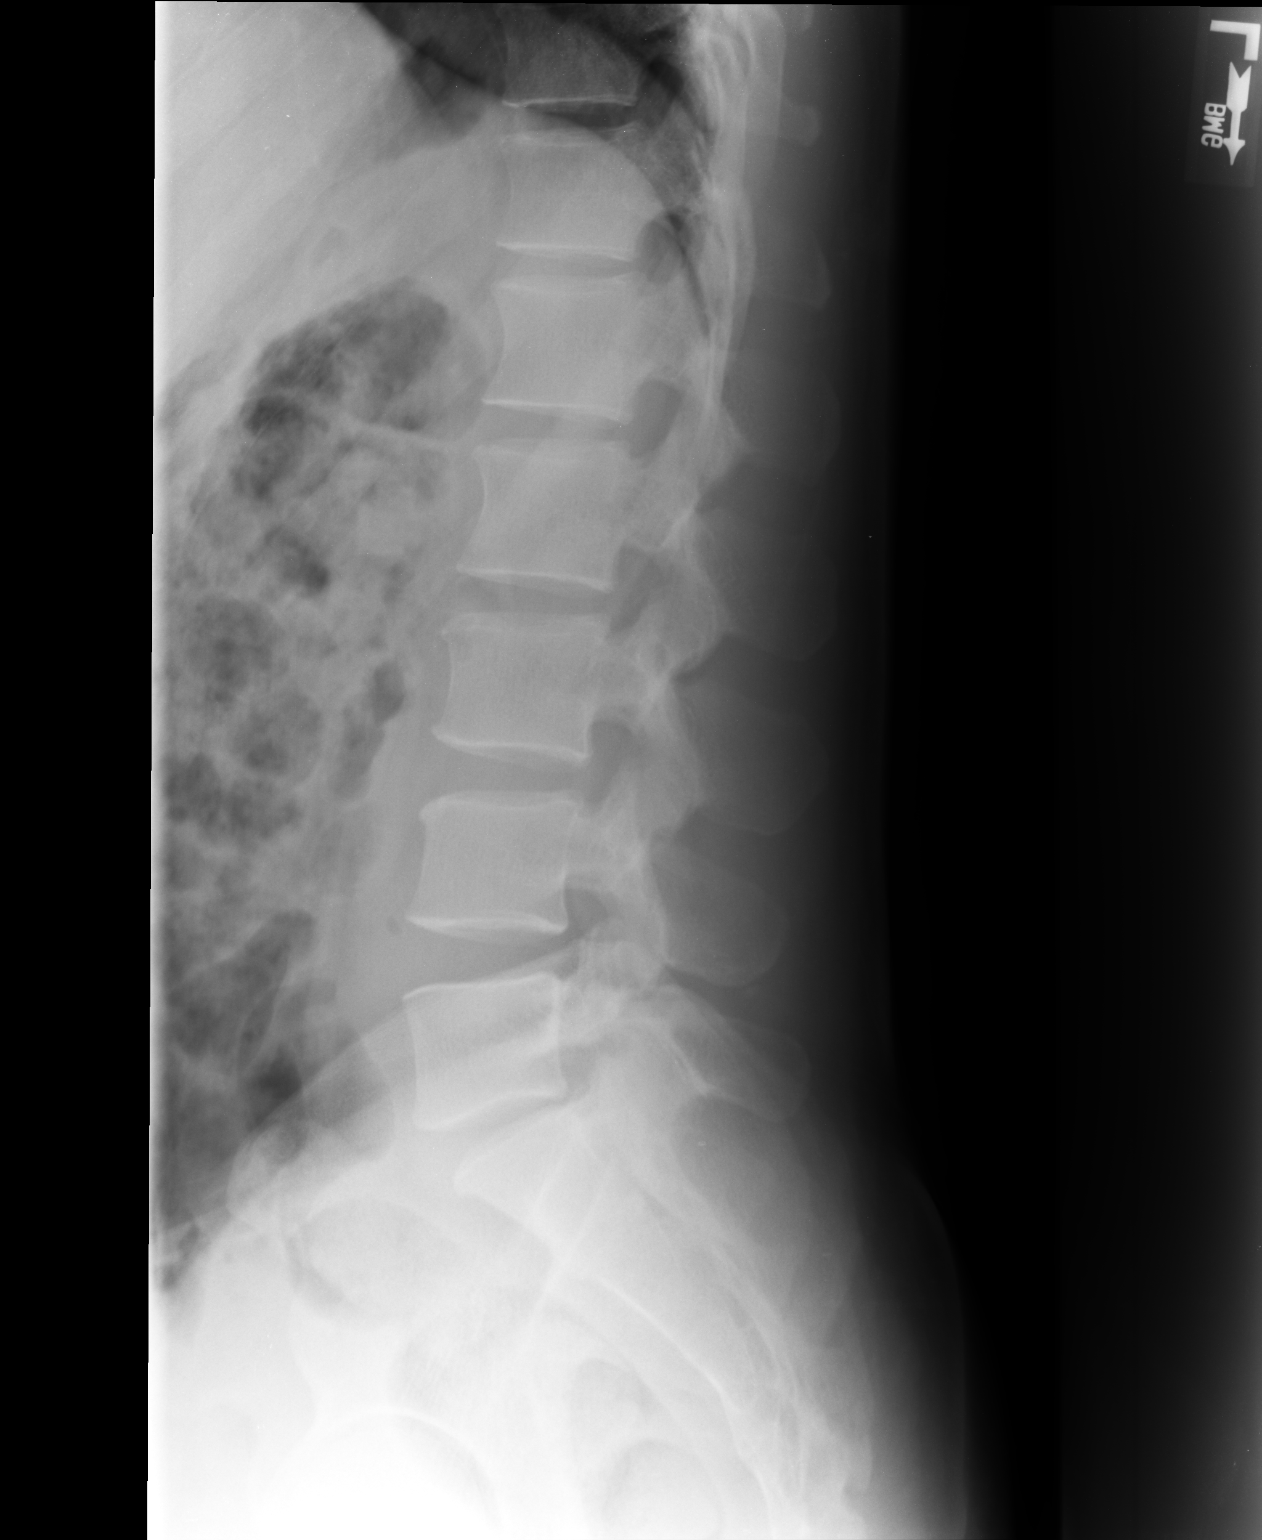

[l5 s1]
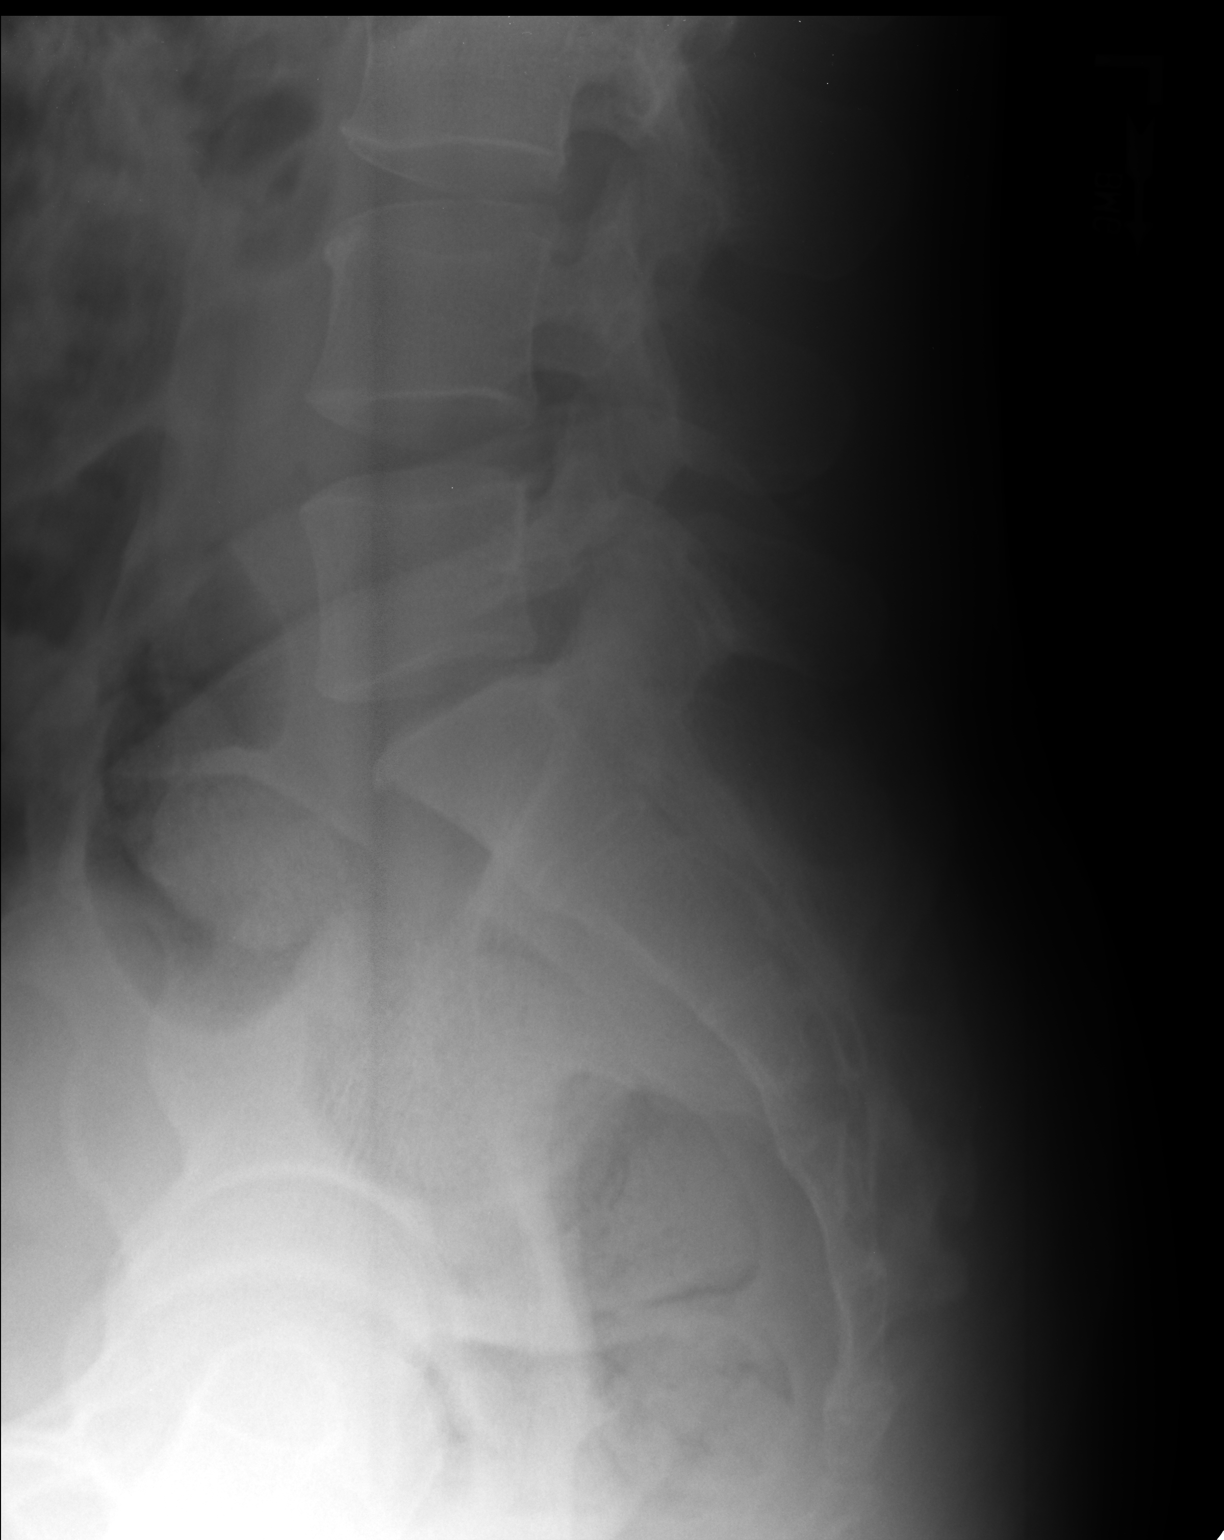

[3 of 3 positions shown; findings below may reference images not displayed]

FINDINGS: No acute fracture or significant spondylolisthesis is noted.
Probable pars defect is seen at L5. Disc spaces are well-maintained.
IMPRESSION: Probable L5 pars defects. No other abnormality seen in the lumbar
spine.

## 2016-11-19 ENCOUNTER — Ambulatory Visit (INDEPENDENT_AMBULATORY_CARE_PROVIDER_SITE_OTHER): Payer: BLUE CROSS/BLUE SHIELD | Admitting: Emergency Medicine

## 2016-11-19 VITALS — BP 110/70 | HR 70 | Temp 98.1°F | Resp 16 | Ht 70.0 in | Wt 168.2 lb

## 2016-11-19 DIAGNOSIS — Z0000 Encounter for general adult medical examination without abnormal findings: Secondary | ICD-10-CM

## 2016-11-19 NOTE — Progress Notes (Signed)
Patient ID: Mitchell Schmidt, male   DOB: 12/26/1975, 41 y.o.   MRN: [id number] Mitchell Schmidt 41 y.o.   Chief Complaint  Patient presents with  . Annual Exam    HISTORY OF PRESENT ILLNESS: This is a 41 y.o. male here for annual exam. No complaints.  HPI   Prior to Admission medications   Not on File    No Known Allergies  Patient Active Problem List   Diagnosis Date Noted  . Prediabetes 09/10/2014    Past Medical History:  Diagnosis Date  . Allergy   . Elevated LFTs   . Pre-diabetes     History reviewed. No pertinent surgical history.  Social History   Social History  . Marital status: Married    Spouse name: N/A  . Number of children: N/A  . Years of education: N/A   Occupational History  . Not on file.   Social History Main Topics  . Smoking status: Never Smoker  . Smokeless tobacco: Never Used  . Alcohol use 0.6 - 1.2 oz/week    1 - 2 Standard drinks or equivalent per week  . Drug use: No  . Sexual activity: Not on file   Other Topics Concern  . Not on file   Social History Narrative  . No narrative on file    History reviewed. No pertinent family history.   Review of Systems  Constitutional: Negative.   HENT: Negative.   Eyes: Negative.   Respiratory: Negative.   Cardiovascular: Negative.   Gastrointestinal: Negative.   Genitourinary: Negative.   Musculoskeletal: Negative.   Skin: Negative.   Neurological: Negative.   Endo/Heme/Allergies: Negative.   Psychiatric/Behavioral: Negative.   All other systems reviewed and are negative.    Physical Exam  Constitutional: He is oriented to person, place, and time. He appears well-developed and well-nourished.  HENT:  Head: Normocephalic and atraumatic.  Right Ear: External ear normal.  Left Ear: External ear normal.  Nose: Nose normal.  Mouth/Throat: Oropharynx is clear and moist.  Eyes: Conjunctivae and EOM are normal. Pupils are equal, round, and reactive to light.  Neck: Normal range  of motion. Neck supple.  Cardiovascular: Normal rate, regular rhythm, normal heart sounds and intact distal pulses.   Pulmonary/Chest: Effort normal and breath sounds normal.  Abdominal: Soft. Bowel sounds are normal. He exhibits no distension. There is no tenderness.  Musculoskeletal: Normal range of motion.  Neurological: He is alert and oriented to person, place, and time. He displays normal reflexes. No sensory deficit. He exhibits normal muscle tone. Coordination normal.  Skin: Skin is warm and dry. Capillary refill takes less than 2 seconds.  Psychiatric: He has a normal mood and affect. His behavior is normal.  Nursing note and vitals reviewed.    ASSESSMENT & PLAN: Muad was seen today for annual exam.  Diagnoses and all orders for this visit:  Routine general medical examination at a health care facility -     CBC with Differential -     Comprehensive metabolic panel -     Hemoglobin A1c -     Lipid panel -     HIV antibody   Patient Instructions       IF you received an x-ray today, you will receive an invoice [facility]. Please contact [facility] at [phone] with questions or concerns regarding your invoice.   IF you received labwork today, you will receive an invoice from [facility]. Please contact [facility] at [phone] with questions or concerns regarding your  invoice.   Our billing staff will not be Jamiel to assist you with questions regarding bills from these companies.  You will be contacted with the lab results as soon as they are available. The fastest way to get your results is to activate your My Chart account. Instructions are located on the last page of this paperwork. If you have not heard from [place] regarding the results in 2 weeks, please contact this office.       Health Maintenance, Male A healthy lifestyle and preventative care can promote health and wellness.  Maintain regular health, dental, and eye exams.  Eat a  healthy diet. Foods like vegetables, fruits, whole grains, low-fat dairy products, and lean protein foods contain the nutrients you need and are low in calories. Decrease your intake of foods high in solid fats, added sugars, and salt. Get information about a proper diet from your health care provider, if necessary.  Regular physical exercise is one of the most important things you can do for your health. Most adults should get at least 150 minutes of moderate-intensity exercise (any activity that increases your heart rate and causes you to sweat) each week. In addition, most adults need muscle-strengthening exercises on 2 or more days a week.   Maintain a healthy weight. The body mass index (BMI) is a screening tool to identify possible weight problems. It provides an estimate of body fat based on height and weight. Your health care provider can find your BMI and can help you achieve or maintain a healthy weight. For males 20 years and older:  A BMI below 18.5 is considered underweight.  A BMI of 18.5 to 24.9 is normal.  A BMI of 25 to 29.9 is considered overweight.  A BMI of 30 and above is considered obese.  Maintain normal blood lipids and cholesterol by exercising and minimizing your intake of saturated fat. Eat a balanced diet with plenty of fruits and vegetables. Blood tests for lipids and cholesterol should begin at age 41 and be repeated every 5 years. If your lipid or cholesterol levels are high, you are over age 51, or you are at high risk for heart disease, you may need your cholesterol levels checked more frequently.Ongoing high lipid and cholesterol levels should be treated with medicines if diet and exercise are not working.  If you smoke, find out from your health care provider how to quit. If you do not use tobacco, do not start.  Lung cancer screening is recommended for adults aged 55-80 years who are at high risk for developing lung cancer because of a history of smoking. A  yearly low-dose CT scan of the lungs is recommended for people who have at least a 30-pack-year history of smoking and are current smokers or have quit within the past 15 years. A pack year of smoking is smoking an average of 1 pack of cigarettes a day for 1 year (for example, a 30-pack-year history of smoking could mean smoking 1 pack a day for 30 years or 2 packs a day for 15 years). Yearly screening should continue until the smoker has stopped smoking for at least 15 years. Yearly screening should be stopped for people who develop a health problem that would prevent them from having lung cancer treatment.  If you choose to drink alcohol, do not have more than 2 drinks per day. One drink is considered to be 12 oz (360 mL) of beer, 5 oz (150 mL) of wine, or 1.5 oz (45  mL) of liquor.  Avoid the use of street drugs. Do not share needles with anyone. Ask for help if you need support or instructions about stopping the use of drugs.  High blood pressure causes heart disease and increases the risk of stroke. High blood pressure is more likely to develop in:  People who have blood pressure in the end of the normal range (100-139/85-89 mm Hg).  People who are overweight or obese.  People who are African American.  If you are 40-21 years of age, have your blood pressure checked every 3-5 years. If you are 57 years of age or older, have your blood pressure checked every year. You should have your blood pressure measured twice-once when you are at a hospital or clinic, and once when you are not at a hospital or clinic. Record the average of the two measurements. To check your blood pressure when you are not at a hospital or clinic, you can use:  An automated blood pressure machine at a pharmacy.  A home blood pressure monitor.  If you are 71-70 years old, ask your health care provider if you should take aspirin to prevent heart disease.  Diabetes screening involves taking a blood sample to check your  fasting blood sugar level. This should be done once every 3 years after age 20 if you are at a normal weight and without risk factors for diabetes. Testing should be considered at a younger age or be carried out more frequently if you are overweight and have at least 1 risk factor for diabetes.  Colorectal cancer can be detected and often prevented. Most routine colorectal cancer screening begins at the age of 4 and continues through age 39. However, your health care provider may recommend screening at an earlier age if you have risk factors for colon cancer. On a yearly basis, your health care provider may provide home test kits to check for hidden blood in the stool. A small camera at the end of a tube may be used to directly examine the colon (sigmoidoscopy or colonoscopy) to detect the earliest forms of colorectal cancer. Talk to your health care provider about this at age 34 when routine screening begins. A direct exam of the colon should be repeated every 5-10 years through age 77, unless early forms of precancerous polyps or small growths are found.  People who are at an increased risk for hepatitis B should be screened for this virus. You are considered at high risk for hepatitis B if:  You were born in a country where hepatitis B occurs often. Talk with your health care provider about which countries are considered high risk.  Your parents were born in a high-risk country and you have not received a shot to protect against hepatitis B (hepatitis B vaccine).  You have HIV or AIDS.  You use needles to inject street drugs.  You live with, or have sex with, someone who has hepatitis B.  You are a man who has sex with other men (MSM).  You get hemodialysis treatment.  You take certain medicines for conditions like cancer, organ transplantation, and autoimmune conditions.  Hepatitis C blood testing is recommended for all people born from [birth year] through 1966 and any individual with known risk  factors for hepatitis C.  Healthy men should no longer receive prostate-specific antigen (PSA) blood tests as part of routine cancer screening. Talk to your health care provider about prostate cancer screening.  Testicular cancer screening is not recommended for adolescents  or adult males who have no symptoms. Screening includes self-exam, a health care provider exam, and other screening tests. Consult with your health care provider about any symptoms you have or any concerns you have about testicular cancer.  Practice safe sex. Use condoms and avoid high-risk sexual practices to reduce the spread of sexually transmitted infections (STIs).  You should be screened for STIs, including gonorrhea and chlamydia if:  You are sexually active and are younger than 24 years.  You are older than 24 years, and your health care provider tells you that you are at risk for this type of infection.  Your sexual activity has changed since you were last screened, and you are at an increased risk for chlamydia or gonorrhea. Ask your health care provider if you are at risk.  If you are at risk of being infected with HIV, it is recommended that you take a prescription medicine daily to prevent HIV infection. This is called pre-exposure prophylaxis (PrEP). You are considered at risk if:  You are a man who has sex with other men (MSM).  You are a heterosexual man who is sexually active with multiple partners.  You take drugs by injection.  You are sexually active with a partner who has HIV.  Talk with your health care provider about whether you are at high risk of being infected with HIV. If you choose to begin PrEP, you should first be tested for HIV. You should then be tested every 3 months for as long as you are taking PrEP.  Use sunscreen. Apply sunscreen liberally and repeatedly throughout the day. You should seek shade when your shadow is shorter than you. Protect yourself by wearing long sleeves, pants, a  wide-brimmed hat, and sunglasses year round whenever you are outdoors.  Tell your health care provider of new moles or changes in moles, especially if there is a change in shape or color. Also, tell your health care provider if a mole is larger than the size of a pencil eraser.  A one-time screening for abdominal aortic aneurysm (AAA) and surgical repair of large AAAs by ultrasound is recommended for men aged 65-75 years who are current or former smokers.  Stay current with your vaccines (immunizations). This information is not intended to replace advice given to you by your health care provider. Make sure you discuss any questions you have with your health care provider. Document Released: 04/23/2008 Document Revised: 11/16/2014 Document Reviewed: 07/30/2015 Elsevier Interactive Patient Education  2017 [employer]  American Heart Association [facility]) Exercise Recommendation  Being physically active is important to prevent heart disease and stroke, the nation's No. 1and No. 5killers. To improve overall cardiovascular health, we suggest at least 150 minutes per week of moderate exercise or 75 minutes per week of vigorous exercise (or a combination of moderate and vigorous activity). Thirty minutes a day, five times a week is an easy goal to remember. You will also experience benefits even if you divide your time into two or three segments of 10 to 15 minutes per day.  For people who would benefit from lowering their blood pressure or cholesterol, we recommend 40 minutes of aerobic exercise of moderate to vigorous intensity three to four times a week to lower the risk for heart attack and stroke.  Physical activity is anything that makes you move your body and burn calories.  This includes things like climbing stairs or playing sports. Aerobic exercises benefit your heart, and include walking, jogging, swimming or biking. Strength  and stretching exercises are best for overall stamina and  flexibility.  The simplest, positive change you can make to effectively improve your heart health is to start walking. It's enjoyable, free, easy, social and great exercise. A walking program is flexible and boasts high success rates because people can stick with it. It's easy for walking to become a regular and satisfying part of life.   For Overall Cardiovascular Health:  At least 30 minutes of moderate-intensity aerobic activity at least 5 days per week for a total of 150  OR   At least 25 minutes of vigorous aerobic activity at least 3 days per week for a total of 75 minutes; or a combination of moderate- and vigorous-intensity aerobic activity  AND   Moderate- to high-intensity muscle-strengthening activity at least 2 days per week for additional health benefits.  For Lowering Blood Pressure and Cholesterol  An average 40 minutes of moderate- to vigorous-intensity aerobic activity 3 or 4 times per week  What if I can't make it to the time goal? Something is always better than nothing! And everyone has to start somewhere. Even if you've been sedentary for years, today is the day you can begin to make healthy changes in your life. If you don't think you'll make it for 30 or 40 minutes, set a reachable goal for today. You can work up toward your overall goal by increasing your time as you get stronger. Don't let all-or-nothing thinking rob you of doing what you can every day.  Source:[email or web address] MD [facility]

## 2016-11-19 NOTE — Patient Instructions (Addendum)
   IF you received an x-ray today, you will receive an invoice from [facility]. Please contact [facility] at [phone] with questions or concerns regarding your invoice.   IF you received labwork today, you will receive an invoice from [facility]. Please contact [facility] at [phone] with questions or concerns regarding your invoice.   Our billing staff will not be [place] to assist you with questions regarding bills from these companies.  You will be contacted with the lab results as soon as they are available. The fastest way to get your results is to activate your My Chart account. Instructions are located on the last page of this paperwork. If you have not heard from us regarding the results in 2 weeks, please contact this office.       Health Maintenance, Male A healthy lifestyle and preventative care can promote health and wellness.  Maintain regular health, dental, and eye exams.  Eat a healthy diet. Foods like vegetables, fruits, whole grains, low-fat dairy products, and lean protein foods contain the nutrients you need and are low in calories. Decrease your intake of foods high in solid fats, added sugars, and salt. Get information about a proper diet from your health care provider, if necessary.  Regular physical exercise is one of the most important things you can do for your health. Most adults should get at least 150 minutes of moderate-intensity exercise (any activity that increases your heart rate and causes you to sweat) each week. In addition, most adults need muscle-strengthening exercises on 2 or more days a week.   Maintain a healthy weight. The body mass index (BMI) is a screening tool to identify possible weight problems. It provides an estimate of body fat based on height and weight. Your health care provider can find your BMI and can help you achieve or maintain a healthy weight. For males 20 years and older:  A BMI below 18.5 is considered  underweight.  A BMI of 18.5 to 24.9 is normal.  A BMI of 25 to 29.9 is considered overweight.  A BMI of 30 and above is considered obese.  Maintain normal blood lipids and cholesterol by exercising and minimizing your intake of saturated fat. Eat a balanced diet with plenty of fruits and vegetables. Blood tests for lipids and cholesterol should begin at age 20 and be repeated every 5 years. If your lipid or cholesterol levels are high, you are over age 50, or you are at high risk for heart disease, you may need your cholesterol levels checked more frequently.Ongoing high lipid and cholesterol levels should be treated with medicines if diet and exercise are not working.  If you smoke, find out from your health care provider how to quit. If you do not use tobacco, do not start.  Lung cancer screening is recommended for adults aged 55-80 years who are at high risk for developing lung cancer because of a history of smoking. A yearly low-dose CT scan of the lungs is recommended for people who have at least a 30-pack-year history of smoking and are current smokers or have quit within the past 15 years. A pack year of smoking is smoking an average of 1 pack of cigarettes a day for 1 year (for example, a 30-pack-year history of smoking could mean smoking 1 pack a day for 30 years or 2 packs a day for 15 years). Yearly screening should continue until the smoker has stopped smoking for at least 15 years. Yearly screening should be stopped   for people who develop a health problem that would prevent them from having lung cancer treatment.  If you choose to drink alcohol, do not have more than 2 drinks per day. One drink is considered to be 12 oz (360 mL) of beer, 5 oz (150 mL) of wine, or 1.5 oz (45 mL) of liquor.  Avoid the use of street drugs. Do not share needles with anyone. Ask for help if you need support or instructions about stopping the use of drugs.  High blood pressure causes heart disease and  increases the risk of stroke. High blood pressure is more likely to develop in:  People who have blood pressure in the end of the normal range (100-139/85-89 mm Hg).  People who are overweight or obese.  People who are African American.  If you are 18-39 years of age, have your blood pressure checked every 3-5 years. If you are 40 years of age or older, have your blood pressure checked every year. You should have your blood pressure measured twice-once when you are at a hospital or clinic, and once when you are not at a hospital or clinic. Record the average of the two measurements. To check your blood pressure when you are not at a hospital or clinic, you can use:  An automated blood pressure machine at a pharmacy.  A home blood pressure monitor.  If you are 45-79 years old, ask your health care provider if you should take aspirin to prevent heart disease.  Diabetes screening involves taking a blood sample to check your fasting blood sugar level. This should be done once every 3 years after age 45 if you are at a normal weight and without risk factors for diabetes. Testing should be considered at a younger age or be carried out more frequently if you are overweight and have at least 1 risk factor for diabetes.  Colorectal cancer can be detected and often prevented. Most routine colorectal cancer screening begins at the age of 50 and continues through age 75. However, your health care provider may recommend screening at an earlier age if you have risk factors for colon cancer. On a yearly basis, your health care provider may provide home test kits to check for hidden blood in the stool. A small camera at the end of a tube may be used to directly examine the colon (sigmoidoscopy or colonoscopy) to detect the earliest forms of colorectal cancer. Talk to your health care provider about this at age 50 when routine screening begins. A direct exam of the colon should be repeated every 5-10 years through  age 75, unless early forms of precancerous polyps or small growths are found.  People who are at an increased risk for hepatitis B should be screened for this virus. You are considered at high risk for hepatitis B if:  You were born in a country where hepatitis B occurs often. Talk with your health care provider about which countries are considered high risk.  Your parents were born in a high-risk country and you have not received a shot to protect against hepatitis B (hepatitis B vaccine).  You have HIV or AIDS.  You use needles to inject street drugs.  You live with, or have sex with, someone who has hepatitis B.  You are a man who has sex with other men (MSM).  You get hemodialysis treatment.  You take certain medicines for conditions like cancer, organ transplantation, and autoimmune conditions.  Hepatitis C blood testing is recommended for   all people born from 1945 through 1965 and any individual with known risk factors for hepatitis C.  Healthy men should no longer receive prostate-specific antigen (PSA) blood tests as part of routine cancer screening. Talk to your health care provider about prostate cancer screening.  Testicular cancer screening is not recommended for adolescents or adult males who have no symptoms. Screening includes self-exam, a health care provider exam, and other screening tests. Consult with your health care provider about any symptoms you have or any concerns you have about testicular cancer.  Practice safe sex. Use condoms and avoid high-risk sexual practices to reduce the spread of sexually transmitted infections (STIs).  You should be screened for STIs, including gonorrhea and chlamydia if:  You are sexually active and are younger than 24 years.  You are older than 24 years, and your health care provider tells you that you are at risk for this type of infection.  Your sexual activity has changed since you were last screened, and you are at an  increased risk for chlamydia or gonorrhea. Ask your health care provider if you are at risk.  If you are at risk of being infected with HIV, it is recommended that you take a prescription medicine daily to prevent HIV infection. This is called pre-exposure prophylaxis (PrEP). You are considered at risk if:  You are a man who has sex with other men (MSM).  You are a heterosexual man who is sexually active with multiple partners.  You take drugs by injection.  You are sexually active with a partner who has HIV.  Talk with your health care provider about whether you are at high risk of being infected with HIV. If you choose to begin PrEP, you should first be tested for HIV. You should then be tested every 3 months for as long as you are taking PrEP.  Use sunscreen. Apply sunscreen liberally and repeatedly throughout the day. You should seek shade when your shadow is shorter than you. Protect yourself by wearing long sleeves, pants, a wide-brimmed hat, and sunglasses year round whenever you are outdoors.  Tell your health care provider of new moles or changes in moles, especially if there is a change in shape or color. Also, tell your health care provider if a mole is larger than the size of a pencil eraser.  A one-time screening for abdominal aortic aneurysm (AAA) and surgical repair of large AAAs by ultrasound is recommended for men aged 65-75 years who are current or former smokers.  Stay current with your vaccines (immunizations). This information is not intended to replace advice given to you by your health care provider. Make sure you discuss any questions you have with your health care provider. Document Released: 04/23/2008 Document Revised: 11/16/2014 Document Reviewed: 07/30/2015 Elsevier Interactive Patient Education  2017 Elsevier Inc.  American Heart Association (AHA) Exercise Recommendation  Being physically active is important to prevent heart disease and stroke, the nation's  No. 1and No. 5killers. To improve overall cardiovascular health, we suggest at least 150 minutes per week of moderate exercise or 75 minutes per week of vigorous exercise (or a combination of moderate and vigorous activity). Thirty minutes a day, five times a week is an easy goal to remember. You will also experience benefits even if you divide your time into two or three segments of 10 to 15 minutes per day.  For people who would benefit from lowering their blood pressure or cholesterol, we recommend 40 minutes of aerobic exercise of moderate   to vigorous intensity three to four times a week to lower the risk for heart attack and stroke.  Physical activity is anything that makes you move your body and burn calories.  This includes things like climbing stairs or playing sports. Aerobic exercises benefit your heart, and include walking, jogging, swimming or biking. Strength and stretching exercises are best for overall stamina and flexibility.  The simplest, positive change you can make to effectively improve your heart health is to start walking. It's enjoyable, free, easy, social and great exercise. A walking program is flexible and boasts high success rates because people can stick with it. It's easy for walking to become a regular and satisfying part of life.   For Overall Cardiovascular Health:  At least 30 minutes of moderate-intensity aerobic activity at least 5 days per week for a total of 150  OR   At least 25 minutes of vigorous aerobic activity at least 3 days per week for a total of 75 minutes; or a combination of moderate- and vigorous-intensity aerobic activity  AND   Moderate- to high-intensity muscle-strengthening activity at least 2 days per week for additional health benefits.  For Lowering Blood Pressure and Cholesterol  An average 40 minutes of moderate- to vigorous-intensity aerobic activity 3 or 4 times per week  What if I can't make it to the time goal? Something is  always better than nothing! And everyone has to start somewhere. Even if you've been sedentary for years, today is the day you can begin to make healthy changes in your life. If you don't think you'll make it for 30 or 40 minutes, set a reachable goal for today. You can work up toward your overall goal by increasing your time as you get stronger. Don't let all-or-nothing thinking rob you of doing what you can every day.  Source:http://www.heart.org    

## 2016-11-20 LAB — CBC WITH DIFFERENTIAL/PLATELET
BASOS: 0 %
Basophils Absolute: 0 10*3/uL (ref 0.0–0.2)
EOS (ABSOLUTE): 0.3 10*3/uL (ref 0.0–0.4)
EOS: 8 %
HEMOGLOBIN: 13.6 g/dL (ref 13.0–17.7)
Hematocrit: 40.1 % (ref 37.5–51.0)
IMMATURE GRANS (ABS): 0 10*3/uL (ref 0.0–0.1)
Immature Granulocytes: 0 %
LYMPHS: 49 %
Lymphocytes Absolute: 2 10*3/uL (ref 0.7–3.1)
MCH: 31.9 pg (ref 26.6–33.0)
MCHC: 33.9 g/dL (ref 31.5–35.7)
MCV: 94 fL (ref 79–97)
MONOCYTES: 7 %
Monocytes Absolute: 0.3 10*3/uL (ref 0.1–0.9)
NEUTROS ABS: 1.5 10*3/uL (ref 1.4–7.0)
Neutrophils: 36 %
Platelets: 265 10*3/uL (ref 150–379)
RBC: 4.26 x10E6/uL (ref 4.14–5.80)
RDW: 14.4 % (ref 12.3–15.4)
WBC: 4.1 10*3/uL (ref 3.4–10.8)

## 2016-11-20 LAB — COMPREHENSIVE METABOLIC PANEL
A/G RATIO: 1.6 (ref 1.2–2.2)
ALBUMIN: 4.8 g/dL (ref 3.5–5.5)
ALT: 34 IU/L (ref 0–44)
AST: 27 IU/L (ref 0–40)
Alkaline Phosphatase: 74 IU/L (ref 39–117)
BILIRUBIN TOTAL: 0.5 mg/dL (ref 0.0–1.2)
BUN / CREAT RATIO: 16 (ref 9–20)
BUN: 18 mg/dL (ref 6–24)
CHLORIDE: 99 mmol/L (ref 96–106)
CO2: 25 mmol/L (ref 18–29)
Calcium: 9.4 mg/dL (ref 8.7–10.2)
Creatinine, Ser: 1.14 mg/dL (ref 0.76–1.27)
GFR calc non Af Amer: 79 mL/min/{1.73_m2} (ref >59)
GFR, EST AFRICAN AMERICAN: 92 mL/min/{1.73_m2} (ref >59)
GLUCOSE: 107 mg/dL — AB (ref 65–99)
Globulin, Total: 3 g/dL (ref 1.5–4.5)
Potassium: 4.5 mmol/L (ref 3.5–5.2)
Sodium: 140 mmol/L (ref 134–144)
TOTAL PROTEIN: 7.8 g/dL (ref 6.0–8.5)

## 2016-11-20 LAB — LIPID PANEL
CHOL/HDL RATIO: 3 ratio (ref 0.0–5.0)
CHOLESTEROL TOTAL: 181 mg/dL (ref 100–199)
HDL: 61 mg/dL (ref >39)
LDL Calculated: 105 mg/dL — ABNORMAL HIGH (ref 0–99)
TRIGLYCERIDES: 74 mg/dL (ref 0–149)
VLDL Cholesterol Cal: 15 mg/dL (ref 5–40)

## 2016-11-20 LAB — HEMOGLOBIN A1C
Est. average glucose Bld gHb Est-mCnc: 131 mg/dL
Hgb A1c MFr Bld: 6.2 % — ABNORMAL HIGH (ref 4.8–5.6)

## 2016-11-20 LAB — HIV ANTIBODY (ROUTINE TESTING W REFLEX): HIV Screen 4th Generation wRfx: NONREACTIVE

## 2017-01-28 ENCOUNTER — Ambulatory Visit (INDEPENDENT_AMBULATORY_CARE_PROVIDER_SITE_OTHER): Payer: BLUE CROSS/BLUE SHIELD | Admitting: Physician Assistant

## 2017-01-28 ENCOUNTER — Telehealth: Payer: Self-pay | Admitting: *Deleted

## 2017-01-28 VITALS — BP 122/74 | HR 70 | Temp 98.6°F | Resp 18 | Ht 70.0 in | Wt 172.8 lb

## 2017-01-28 DIAGNOSIS — Z0283 Encounter for blood-alcohol and blood-drug test: Secondary | ICD-10-CM | POA: Diagnosis not present

## 2017-01-28 NOTE — Patient Instructions (Signed)
     IF you received an x-ray today, you will receive an invoice from [facility]. Please contact [facility] at [phone] with questions or concerns regarding your invoice.   IF you received labwork today, you will receive an invoice from [facility]. Please contact [facility] at [phone] with questions or concerns regarding your invoice.   Our billing staff will not be [place] to assist you with questions regarding bills from these companies.  You will be contacted with the lab results as soon as they are available. The fastest way to get your results is to activate your My Chart account. Instructions are located on the last page of this paperwork. If you have not heard from us regarding the results in 2 weeks, please contact this office.     

## 2017-01-28 NOTE — Progress Notes (Signed)
Patient here for drug screen for work.  He has no complaints.  His vital signs are normal. Will fax results to [phone].   Mitchell Schmidt was seen today for drug / alcohol assessment.  Diagnoses and all orders for this visit:  Encounter for drug screening -     Ethanol -     Urine drug profile, 9 drugs ([facility])

## 2017-01-28 NOTE — Telephone Encounter (Signed)
jerriann called back and I told her patient has an appointment at 11:00 today for D/S without an account set up.  He has the requisition, she said she will call the location to let them know.

## 2017-01-28 NOTE — Telephone Encounter (Signed)
[facility] to let her know the patient has appointment at 11:00 am to have D/S. I told him we cancelled all test because he does not have an account set up. He probably misunderstood.

## 2017-01-28 NOTE — Telephone Encounter (Signed)
Called patient to advise he needs to setup an account with a company/lab to do D/S or alcohol testing. The blood alcohol test drawn here will be cancelled because he does not have an account here. I gave him a toll free number [phone]  to call and setup account by his choice.

## 2017-01-28 NOTE — Addendum Note (Signed)
Addended by: Baldwin CrownJOHNSON, SHAQUETTA D on: 01/28/2017 10:45 AM   Modules accepted: Orders

## 2017-08-13 ENCOUNTER — Ambulatory Visit: Payer: BLUE CROSS/BLUE SHIELD | Admitting: Urgent Care

## 2017-08-13 ENCOUNTER — Ambulatory Visit (INDEPENDENT_AMBULATORY_CARE_PROVIDER_SITE_OTHER): Payer: BLUE CROSS/BLUE SHIELD | Admitting: Physician Assistant

## 2017-08-13 ENCOUNTER — Encounter: Payer: Self-pay | Admitting: Physician Assistant

## 2017-08-13 VITALS — BP 100/80 | HR 64 | Temp 98.0°F | Resp 16 | Ht 70.0 in | Wt 173.2 lb

## 2017-08-13 DIAGNOSIS — R7303 Prediabetes: Secondary | ICD-10-CM

## 2017-08-13 DIAGNOSIS — Z13220 Encounter for screening for lipoid disorders: Secondary | ICD-10-CM | POA: Diagnosis not present

## 2017-08-13 LAB — POCT URINALYSIS DIP (MANUAL ENTRY)
Bilirubin, UA: NEGATIVE
Blood, UA: NEGATIVE
Glucose, UA: NEGATIVE mg/dL
Ketones, POC UA: NEGATIVE mg/dL
Leukocytes, UA: NEGATIVE
Nitrite, UA: NEGATIVE
Protein Ur, POC: NEGATIVE mg/dL
Spec Grav, UA: 1.025 (ref 1.010–1.025)
Urobilinogen, UA: 0.2 E.U./dL
pH, UA: 7 (ref 5.0–8.0)

## 2017-08-13 NOTE — Patient Instructions (Addendum)
Do not drink much water or other drinks past 7:30 pm. This may help cut down on the amount you need to wake up to urinate at night.  We will contact you with the results of your lab work.  See below for instructions on preventing diabetes.   Come back in 4 months for your annual physical.  Preventing Type 2 Diabetes Mellitus Type 2 diabetes (type 2 diabetes mellitus) is a long-term (chronic) disease that affects blood sugar (glucose) levels. Normally, a hormone called insulin allows glucose to enter cells in the body. The cells use glucose for energy. In type 2 diabetes, one or both of these problems may be present:  The body does not make enough insulin.  The body does not respond properly to insulin that it makes (insulin resistance).  Insulin resistance or lack of insulin causes excess glucose to build up in the blood instead of going into cells. As a result, high blood glucose (hyperglycemia) develops, which can cause many complications. Being overweight or obese and having an inactive (sedentary) lifestyle can increase your risk for diabetes. Type 2 diabetes can be delayed or prevented by making certain nutrition and lifestyle changes. What nutrition changes can be made?  Eat healthy meals and snacks regularly. Keep a healthy snack with you for when you get hungry between meals, such as fruit or a handful of nuts.  Eat lean meats and proteins that are low in saturated fats, such as chicken, fish, egg whites, and beans. Avoid processed meats.  Eat plenty of fruits and vegetables and plenty of grains that have not been processed (whole grains). It is recommended that you eat: ? 1?2 cups of fruit every day. ? 2?3 cups of vegetables every day. ? 6?8 oz of whole grains every day, such as oats, whole wheat, bulgur, brown rice, quinoa, and millet.  Eat low-fat dairy products, such as milk, yogurt, and cheese.  Eat foods that contain healthy fats, such as nuts, avocado, olive oil, and  canola oil.  Drink water throughout the day. Avoid drinks that contain added sugar, such as soda or sweet tea.  Follow instructions from your health care provider about specific eating or drinking restrictions.  Control how much food you eat at a time (portion size). ? Check food labels to find out the serving sizes of foods. ? Use a kitchen scale to weigh amounts of foods.  Saute or steam food instead of frying it. Cook with water or broth instead of oils or butter.  Limit your intake of: ? Salt (sodium). Have no more than 1 tsp (2,400 mg) of sodium a day. If you have heart disease or high blood pressure, have less than ? tsp (1,500 mg) of sodium a day. ? Saturated fat. This is fat that is solid at room temperature, such as butter or fat on meat. What lifestyle changes can be made?  Activity  Do moderate-intensity physical activity for at least 30 minutes on at least 5 days of the week, or as much as told by your health care provider.  Ask your health care provider what activities are safe for you. A mix of physical activities may be best, such as walking, swimming, cycling, and strength training.  Try to add physical activity into your day. For example: ? Park in spots that are farther away than usual, so that you walk more. For example, park in a far corner of the parking lot when you go to the office or the grocery store. ?  Take a walk during your lunch break. ? Use stairs instead of elevators or escalators. Weight Loss  Lose weight as directed. Your health care provider can determine how much weight loss is best for you and can help you lose weight safely.  If you are overweight or obese, you may be instructed to lose at least 5?7 % of your body weight. Alcohol and Tobacco   Limit alcohol intake to no more than 1 drink a day for nonpregnant women and 2 drinks a day for men. One drink equals 12 oz of beer, 5 oz of wine, or 1 oz of hard liquor.  Do not use any tobacco  products, such as cigarettes, chewing tobacco, and e-cigarettes. If you need help quitting, ask your health care provider. Work With [place] Provider  Have your blood glucose tested regularly, as told by your health care provider.  Discuss your risk factors and how you can reduce your risk for diabetes.  Get screening tests as told by your health care provider. You may have screening tests regularly, especially if you have certain risk factors for type 2 diabetes.  Make an appointment with a diet and nutrition specialist (registered dietitian). A registered dietitian can help you make a healthy eating plan and can help you understand portion sizes and food labels. Why are these changes important?  It is possible to prevent or delay type 2 diabetes and related health problems by making lifestyle and nutrition changes.  It can be difficult to recognize signs of type 2 diabetes. The best way to avoid possible damage to your body is to take actions to prevent the disease before you develop symptoms. What can happen if changes are not made?  Your blood glucose levels may keep increasing. Having high blood glucose for a long time is dangerous. Too much glucose in your blood can damage your blood vessels, heart, kidneys, nerves, and eyes.  You may develop prediabetes or type 2 diabetes. Type 2 diabetes can lead to many chronic health problems and complications, such as: ? Heart disease. ? Stroke. ? Blindness. ? Kidney disease. ? Depression. ? Poor circulation in the feet and legs, which could lead to surgical removal (amputation) in severe cases. Where to find support:  Ask your health care provider to recommend a registered dietitian, diabetes educator, or weight loss program.  Look for local or online weight loss groups.  Join a gym, fitness club, or outdoor activity group, such as a walking club. Where to find more information: To learn more about diabetes and diabetes  prevention, visit:  American Diabetes Association (ADA): www.diabetes.[employer] of Diabetes and Digestive and Kidney Diseases: FindSpin.nl  To learn more about healthy eating, visit:  The U.S. Department of Agriculture Scientist, research (physical sciences)), Choose My Plate: http://wiley-williams.com/  Office of Disease Prevention and Health Promotion (ODPHP), Dietary Guidelines: SurferLive.at  Summary  You can reduce your risk for type 2 diabetes by increasing your physical activity, eating healthy foods, and losing weight as directed.  Talk with your health care provider about your risk for type 2 diabetes. Ask about any blood tests or screening tests that you need to have. This information is not intended to replace advice given to you by your health care provider. Make sure you discuss any questions you have with your health care provider. Document Released: 02/17/2016 Document Revised: 04/02/2016 Document Reviewed: 12/17/2015 Elsevier Interactive Patient Education  [employer].    Thank you for coming in today. I hope you  feel we met your needs.  Feel free to call PCP if you have any questions or further requests.  Please consider signing up for MyChart if you do not already have it, as this is a great way to communicate with me.  Best,  Whitney McVey, PA-C IF you received an x-ray today, you will receive an invoice from [facility]. Please contact [facility] at [phone] with questions or concerns regarding your invoice.   IF you received labwork today, you will receive an invoice from [facility]. Please contact [facility] at [phone] with questions or concerns regarding your invoice.   Our billing staff will not be [place] to assist you with questions regarding bills from these companies.  You will be contacted with the lab results as soon as they are available. The fastest way to get your results is to  activate your My Chart account. Instructions are located on the last page of this paperwork. If you have not heard from [place] regarding the results in 2 weeks, please contact this office.

## 2017-08-13 NOTE — Progress Notes (Signed)
Mitchell Schmidt  MRN: [id number] DOB: February 05, 1976  PCP: Patient, No Pcp Per  Subjective:  Pt is a 41 year old male who presents to clinic for recheck blood sugar. He was here 11/2016 for annual exam. A1C indicates prediabetes. He would like this rechecked.    Breakfast - burritos from [employer] - African food he makes at home: rice, yam, fish, sometimes meat.  Dinner - salad from [employer], African food He drinks no soda. Mostly water - drinks 0.5-1 gallon daily. He drinks apple juice sometimes with meals.  He wakes up 2-3 times a night to urinate. This has increased in the past month or so.   Exercise - 2-3 times a month.   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 Has a refrigerator in his truck and keeps this stocked so he can make his lunches/dinner.   FHx - healthy.   Review of Systems  Constitutional: Negative for fatigue.  Eyes: Negative for visual disturbance.  Cardiovascular: Negative for chest pain, palpitations and leg swelling.  Endocrine: Positive for polyuria. Negative for polydipsia and polyphagia.  Skin: Negative for rash.  Neurological: Negative for weakness, numbness and headaches.   Patient Active Problem List   Diagnosis Date Noted  . Prediabetes 09/10/2014    No current outpatient prescriptions on file prior to visit.   No current facility-administered medications on file prior to visit.     No Known Allergies   Objective:  BP 100/80   Pulse 64   Temp 98 F (36.7 C) (Oral)   Resp 16   Ht 5' 10" (1.778 m)   Wt 173 lb 3.2 oz (78.6 kg)   SpO2 100%   BMI 24.85 kg/m   Physical Exam  Constitutional: He is oriented to person, place, and time and well-developed, well-nourished, and in no distress. No distress.  Cardiovascular: Normal rate, regular rhythm and normal heart sounds.   Pulmonary/Chest: Effort normal and breath sounds normal.  Neurological: He is alert and oriented to person, place, and time.  GCS score is 15.  Skin: Skin is warm and dry. No lesion and no rash noted.  Psychiatric: Mood, memory, affect and judgment normal.  Vitals reviewed.  Lab Results  Component Value Date   HGBA1C 6.2 (H) 11/19/2016    Assessment and Plan :  1. Prediabetes 2. Screening, lipid - Hemoglobin A1c - CMP14+EGFR - POCT urinalysis dipstick - Lipid panel - pt here for recheck A1C. Advised healthy eating habits and increase exercise. Labs are pending. Will contact with results.  RTC in 4 months for annual exam.   Mercer Pod, PA-C  [facility] [place] 08/13/2017 9:00 AM

## 2017-08-14 LAB — CMP14+EGFR
ALT: 52 IU/L — ABNORMAL HIGH (ref 0–44)
AST: 37 IU/L (ref 0–40)
Albumin/Globulin Ratio: 1.6 (ref 1.2–2.2)
Albumin: 4.6 g/dL (ref 3.5–5.5)
Alkaline Phosphatase: 79 IU/L (ref 39–117)
BUN/Creatinine Ratio: 13 (ref 9–20)
BUN: 15 mg/dL (ref 6–24)
Bilirubin Total: 0.3 mg/dL (ref 0.0–1.2)
CO2: 27 mmol/L (ref 20–29)
Calcium: 9.5 mg/dL (ref 8.7–10.2)
Chloride: 102 mmol/L (ref 96–106)
Creatinine, Ser: 1.15 mg/dL (ref 0.76–1.27)
GFR calc Af Amer: 90 mL/min/{1.73_m2} (ref >59)
GFR calc non Af Amer: 78 mL/min/{1.73_m2} (ref >59)
Globulin, Total: 2.9 g/dL (ref 1.5–4.5)
Glucose: 128 mg/dL — ABNORMAL HIGH (ref 65–99)
Potassium: 4.5 mmol/L (ref 3.5–5.2)
Sodium: 141 mmol/L (ref 134–144)
Total Protein: 7.5 g/dL (ref 6.0–8.5)

## 2017-08-14 LAB — LIPID PANEL
Chol/HDL Ratio: 3.6 ratio (ref 0.0–5.0)
Cholesterol, Total: 207 mg/dL — ABNORMAL HIGH (ref 100–199)
HDL: 58 mg/dL (ref >39)
LDL Calculated: 123 mg/dL — ABNORMAL HIGH (ref 0–99)
Triglycerides: 131 mg/dL (ref 0–149)
VLDL Cholesterol Cal: 26 mg/dL (ref 5–40)

## 2017-08-14 LAB — HEMOGLOBIN A1C
Est. average glucose Bld gHb Est-mCnc: 148 mg/dL
Hgb A1c MFr Bld: 6.8 % — ABNORMAL HIGH (ref 4.8–5.6)

## 2017-08-23 ENCOUNTER — Telehealth: Payer: Self-pay | Admitting: Physician Assistant

## 2017-08-23 NOTE — Telephone Encounter (Signed)
PATIENT SAW Mitchell Schmidt ON 08/13/17 AND SHE DID SOME BLOOD WORK ON HIM. HE SAID HE IS STILL WAITING TO HEAR FROM HER REGARDING THE RESULTS. HE WANTS TO KNOW IF HE HAS DIABETES? BEST PHONE [phone] (CELL) PHARMACY CHOICE IF NEEDED IS [facility] ON [address]. MBC

## 2017-08-26 NOTE — Telephone Encounter (Signed)
Per phone message pt wants lab results.  Not released yet Whitney McVey, please advise

## 2017-08-27 ENCOUNTER — Encounter: Payer: Self-pay | Admitting: Physician Assistant

## 2017-08-27 NOTE — Progress Notes (Signed)
Please call pt and tell him Your hemoglobin A1C continues to rise. Stop eating burritos from [employer] every morning. Switch this out for a more healthy option, like steel cut oatmeal - the American Heart Association has great options. Try to cut down on fast food, fatty foods, fried foods, limit red meat to once a week, eat balanced meals including a serving of vegetables and/or fruits with every meal. These changes will also help lower your cholesterol, which is also a little elevated. Come back in 4 months for your annual exam. We will recheck these levels. If needed, we may need to start a medication to bring down your blood sugar levels. I sent him a letter in the mail with these results.

## 2017-08-27 NOTE — Telephone Encounter (Signed)
Pt called wanting labs read to him advised pt that they have not be released as of yet..Marland Kitchen

## 2017-11-01 ENCOUNTER — Encounter: Payer: Self-pay | Admitting: Family Medicine

## 2017-11-01 ENCOUNTER — Ambulatory Visit: Payer: BLUE CROSS/BLUE SHIELD | Admitting: Family Medicine

## 2017-11-01 ENCOUNTER — Other Ambulatory Visit: Payer: Self-pay

## 2017-11-01 VITALS — BP 118/72 | HR 67 | Temp 98.6°F | Resp 16 | Ht 70.0 in | Wt 167.0 lb

## 2017-11-01 DIAGNOSIS — E119 Type 2 diabetes mellitus without complications: Secondary | ICD-10-CM | POA: Diagnosis not present

## 2017-11-01 DIAGNOSIS — R7303 Prediabetes: Secondary | ICD-10-CM | POA: Diagnosis not present

## 2017-11-01 LAB — GLUCOSE, POCT (MANUAL RESULT ENTRY): POC GLUCOSE: 129 mg/dL — AB (ref 70–99)

## 2017-11-01 LAB — POCT GLYCOSYLATED HEMOGLOBIN (HGB A1C): Hemoglobin A1C: 6.5

## 2017-11-01 MED ORDER — BLOOD GLUCOSE METER KIT: PACK | 0 refills | Status: AC

## 2017-11-01 NOTE — Progress Notes (Signed)
Patient ID: Mitchell Schmidt, male    DOB: November 16, 1975  Age: 41 y.o. MRN: [id number]  Chief Complaint  Patient presents with  . Diabetes    DM check/ lab work    Subjective:   41 year old man from [place] who has lived in the [place] for 14 years.  He is going home for a visit to [place] for 2 months from January until March.  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  He gets limited exercise when he is working, but tries to go to [employer] a couple of days a week.  He has been watching his diet closely since his last visit.  Current allergies, medications, problem list, past/family and social histories reviewed.  Objective:  BP 118/72   Pulse 67   Temp 98.6 F (37 C) (Oral)   Resp 16   Ht 5' 10"  (1.778 m)   Wt 167 lb (75.8 kg)   SpO2 96%   BMI 23.96 kg/m   Hemoglobin A1c has come down from 6.8-6.5.  Did not do additional exam today.  Assessment & Plan:   Assessment: 1. Type 2 diabetes mellitus without complication, without long-term current use of insulin ([name])   2. Prediabetes       Plan: Long discussion about self-care with his diabetes and monitoring his sugars.  We will have him come back after he is been back in the [place] in about a month, so late April.  Orders Placed This Encounter  Procedures  . POCT glucose (manual entry)  . POCT glycosylated hemoglobin (Hb A1C)    Meds ordered this encounter  Medications  . blood glucose meter kit and supplies    Sig: Dispense based on patient and insurance preference. Use up to four times daily as directed. (FOR ICD-10 E10.9, E11.9).    Dispense:  1 each    Refill:  0    Order Specific Question:   Number of strips    Answer:   180    Order Specific Question:   Number of lancets    Answer:   180         Patient Instructions   Continue to watch your diet.  Try to eat more vegetables and fruit and lean meat, and avoid the sweets and starches (excessive  rice, breads, pastas, potatoes, etc.).  I encourage you to go to the American diabetic Association website diabetes.org and read as much as you can.  It has a lot of good general information about diabetes and how to take care of yourself and how you should eat.  We will not begin you on medication yet, but if your hemoglobin A1c does not come down further you will probably need to be begun on a low dose of metformin.  Plan to return for a recheck after you get back from [place], sometime in late April.  You are being given a prescription for a blood glucose meter.  Using this you can check your sugar in the mornings fasting and on other days you can check it about 2 hours after your main meal.  Write these numbers down and keep a record of them so that you can show them to your provider at your next visit Mitchell Schmidt, [place]).  You do not have to record them every day, but several days a week will give you an idea of what your sugars are running.  The goal is to  have the sugar ideally 80-100 fasting (although less than 120 is acceptable for diabetes) and 140 or less 2 hours after your main meal (may be up to 180 in a diabetic).  If your sugars are running too high you need to pay better attention to avoiding the starches in your diet.  I encourage you to get a step counter for your cell phone.  This can help remind you how much you have walk each day, and I would urge you to try and stay active until you are walking about 10,000 steps each day.    IF you received an x-ray today, you will receive an invoice from [facility]. Please contact [facility] at [phone] with questions or concerns regarding your invoice.   IF you received labwork today, you will receive an invoice from [facility]. Please contact [facility] at [phone] with questions or concerns regarding your invoice.   Our billing staff will not be Mitchell Schmidt to assist you with questions regarding bills from these  companies.  You will be contacted with the lab results as soon as they are available. The fastest way to get your results is to activate your My Chart account. Instructions are located on the last page of this paperwork. If you have not heard from [place] regarding the results in 2 weeks, please contact this office.         Return in about 4 months (around 03/02/2018), or McVey, for Diabetes.   HOPPER,DAVID, MD 11/01/2017

## 2017-11-01 NOTE — Patient Instructions (Addendum)
Continue to watch your diet.  Try to eat more vegetables and fruit and lean meat, and avoid the sweets and starches (excessive rice, breads, pastas, potatoes, etc.).  I encourage you to go to the American diabetic Association website diabetes.org and read as much as you can.  It has a lot of good general information about diabetes and how to take care of yourself and how you should eat. This website probably also has information on how to measure and record your blood sugar.  We will not begin you on medication yet, but if your hemoglobin A1c does not come down further you will probably need to be begun on a low dose of metformin.  Plan to return for a recheck after you get back from [place], sometime in late April.  You are being given a prescription for a blood glucose meter.  Using this you can check your sugar in the mornings fasting and on other days you can check it about 2 hours after your main meal.  Write these numbers down and keep a record of them so that you can show them to your provider at your next visit Peggye Pitt(Elizabeth Mcveigh, FNP).  You do not have to record them every day, but several days a week will give you an idea of what your sugars are running.  The goal is to have the sugar ideally 80-100 fasting (although less than 120 is acceptable for diabetes) and 140 or less 2 hours after your main meal (may be up to 180 in a diabetic).  If your sugars are running too high you need to pay better attention to avoiding the starches in your diet.  I encourage you to get a step counter for your cell phone.  This can help remind you how much you have walk each day, and I would urge you to try and stay active until you are walking about 10,000 steps each day.    IF you received an x-ray today, you will receive an invoice from [facility]. Please contact [facility] at [phone] with questions or concerns regarding your invoice.   IF you received labwork today, you will receive  an invoice from [facility]. Please contact [facility] at [phone] with questions or concerns regarding your invoice.   Our billing staff will not be [place] to assist you with questions regarding bills from these companies.  You will be contacted with the lab results as soon as they are available. The fastest way to get your results is to activate your My Chart account. Instructions are located on the last page of this paperwork. If you have not heard from us regarding the results in 2 weeks, please contact this office.

## 2018-02-20 ENCOUNTER — Other Ambulatory Visit: Payer: Self-pay | Admitting: Family Medicine

## 2018-03-02 ENCOUNTER — Encounter: Payer: Self-pay | Admitting: Physician Assistant

## 2018-03-02 ENCOUNTER — Ambulatory Visit: Payer: BLUE CROSS/BLUE SHIELD | Admitting: Physician Assistant

## 2018-03-02 ENCOUNTER — Other Ambulatory Visit: Payer: Self-pay

## 2018-03-02 VITALS — BP 118/74 | HR 60 | Temp 98.5°F | Resp 16 | Ht 70.5 in | Wt 163.4 lb

## 2018-03-02 DIAGNOSIS — R7303 Prediabetes: Secondary | ICD-10-CM | POA: Diagnosis not present

## 2018-03-02 LAB — POCT GLYCOSYLATED HEMOGLOBIN (HGB A1C): Hemoglobin A1C: 6.2

## 2018-03-02 NOTE — Progress Notes (Signed)
   Mitchell Schmidt  MRN: [id number] DOB: 1975/12/26  PCP: Dorise Hiss, PA-C  Subjective:  Pt is a pleasant 42 year old male PMH prediabetes who presents to clinic for f/u prediabetes. He is from [place].  Last OV for this problem was 10/2017. 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   He was not started on medication for prediabetes and agreed to work on lifestyle changes. Today he states that checking home blood sugars have helped him a lot in understanding foods and how they affect sugars. He is avoiding foods that elevate blood sugars. Black coffee, meat, oranges and salads are good.  Home blood sugars are between 88-150.   ROS below.  Review of Systems  Constitutional: Negative for chills and diaphoresis.  Gastrointestinal: Negative for abdominal pain, diarrhea, nausea and vomiting.  Endocrine: Negative for polydipsia, polyphagia and polyuria.  Neurological: Negative for dizziness, light-headedness, numbness and headaches.    Patient Active Problem List   Diagnosis Date Noted  . Prediabetes 09/10/2014    Current Outpatient Medications on File Prior to Visit  Medication Sig Dispense Refill  . blood glucose meter kit and supplies Dispense based on patient and insurance preference. Use up to four times daily as directed. (FOR ICD-10 E10.9, E11.9). 1 each 0  . CONTOUR NEXT TEST test strip TEST FOUR TIMES DAILY AS DIRECTED 200 each 0   No current facility-administered medications on file prior to visit.     No Known Allergies   Objective:  BP 118/74   Pulse 60   Temp 98.5 F (36.9 C) (Oral)   Resp 16   Ht 5' 10.5" (1.791 m)   Wt 163 lb 6.4 oz (74.1 kg)   SpO2 99%   BMI 23.11 kg/m   Physical Exam  Constitutional: He is oriented to person, place, and time. He appears well-developed and well-nourished.  Cardiovascular: Normal rate and regular rhythm.  Pulmonary/Chest: Effort  normal. No respiratory distress.  Neurological: He is alert and oriented to person, place, and time.  Skin: Skin is warm and dry.  Psychiatric: He has a normal mood and affect. His behavior is normal. Judgment and thought content normal.  Vitals reviewed.   Lab Results  Component Value Date   HGBA1C 6.5 11/01/2017   Results for orders placed or performed in visit on 03/02/18  POCT glycosylated hemoglobin (Hb A1C)  Result Value Ref Range   Hemoglobin A1C 6.2     Assessment and Plan :  1. Prediabetes - POCT glycosylated hemoglobin (Hb A1C) - Pt presents for f/u pre-DM. He is doing quite well. Checking home blood sugars 80-150. A1C improved from 6.5 to 6.2. Encouraged con't lifestyle improvements. RTC in 3-6 months for recheck.   Mercer Pod, PA-C  Primary [facility] at [place] 03/02/2018 10:19 AM

## 2018-03-02 NOTE — Patient Instructions (Addendum)
Great news! Your hemoglobin A1C is going down. Last A1C 6.5. Today's is 6.2.   Continue working hard on improving your diet and getting in regular exercise. As long as this number continues to decline you do not have to start diabetes medication.   Diabetes.org is a great resource to learn more about how to help lower blood sugar levels.  Try to get at least 5,000-10,000 steps a day.  Below is information about diabetes and nutrition, you are considered pre-diabetic and the information below will help you with food choices.  Come back for recheck in 3-6 months.   Diabetes Mellitus and Nutrition When you have diabetes (diabetes mellitus), it is very important to have healthy eating habits because your blood sugar (glucose) levels are greatly affected by what you eat and drink. Eating healthy foods in the appropriate amounts, at about the same times every day, can help you:  Control your blood glucose.  Lower your risk of heart disease.  Improve your blood pressure.  Reach or maintain a healthy weight.  Every person with diabetes is different, and each person has different needs for a meal plan. Your health care provider may recommend that you work with a diet and nutrition specialist (dietitian) to make a meal plan that is best for you. Your meal plan may vary depending on factors such as:  The calories you need.  The medicines you take.  Your weight.  Your blood glucose, blood pressure, and cholesterol levels.  Your activity level.  Other health conditions you have, such as heart or kidney disease.  How do carbohydrates affect me? Carbohydrates affect your blood glucose level more than any other type of food. Eating carbohydrates naturally increases the amount of glucose in your blood. Carbohydrate counting is a method for keeping track of how many carbohydrates you eat. Counting carbohydrates is important to keep your blood glucose at a healthy level, especially if you use insulin  or take certain oral diabetes medicines. It is important to know how many carbohydrates you can safely have in each meal. This is different for every person. Your dietitian can help you calculate how many carbohydrates you should have at each meal and for snack. Foods that contain carbohydrates include:  Bread, cereal, rice, pasta, and crackers.  Potatoes and corn.  Peas, beans, and lentils.  Milk and yogurt.  Fruit and juice.  Desserts, such as cakes, cookies, ice cream, and candy.  How does alcohol affect me? Alcohol can cause a sudden decrease in blood glucose (hypoglycemia), especially if you use insulin or take certain oral diabetes medicines. Hypoglycemia can be a life-threatening condition. Symptoms of hypoglycemia (sleepiness, dizziness, and confusion) are similar to symptoms of having too much alcohol. If your health care provider says that alcohol is safe for you, follow these guidelines:  Limit alcohol intake to no more than 1 drink per day for nonpregnant women and 2 drinks per day for men. One drink equals 12 oz of beer, 5 oz of wine, or 1 oz of hard liquor.  Do not drink on an empty stomach.  Keep yourself hydrated with water, diet soda, or unsweetened iced tea.  Keep in mind that regular soda, juice, and other mixers may contain a lot of sugar and must be counted as carbohydrates.  What are tips for following this plan? Reading food labels  Start by checking the serving size on the label. The amount of calories, carbohydrates, fats, and other nutrients listed on the label are based on  one serving of the food. Many foods contain more than one serving per package.  Check the total grams (g) of carbohydrates in one serving. You can calculate the number of servings of carbohydrates in one serving by dividing the total carbohydrates by 15. For example, if a food has 30 g of total carbohydrates, it would be equal to 2 servings of carbohydrates.  Check the number of grams  (g) of saturated and trans fats in one serving. Choose foods that have low or no amount of these fats.  Check the number of milligrams (mg) of sodium in one serving. Most people should limit total sodium intake to less than 2,300 mg per day.  Always check the nutrition information of foods labeled as "low-fat" or "nonfat". These foods may be higher in added sugar or refined carbohydrates and should be avoided.  Talk to your dietitian to identify your daily goals for nutrients listed on the label. Shopping  Avoid buying canned, premade, or processed foods. These foods tend to be high in fat, sodium, and added sugar.  Shop around the outside edge of the grocery store. This includes fresh fruits and vegetables, bulk grains, fresh meats, and fresh dairy. Cooking  Use low-heat cooking methods, such as baking, instead of high-heat cooking methods like deep frying.  Cook using healthy oils, such as olive, canola, or sunflower oil.  Avoid cooking with butter, cream, or high-fat meats. Meal planning  Eat meals and snacks regularly, preferably at the same times every day. Avoid going long periods of time without eating.  Eat foods high in fiber, such as fresh fruits, vegetables, beans, and whole grains. Talk to your dietitian about how many servings of carbohydrates you can eat at each meal.  Eat 4-6 ounces of lean protein each day, such as lean meat, chicken, fish, eggs, or tofu. 1 ounce is equal to 1 ounce of meat, chicken, or fish, 1 egg, or 1/4 cup of tofu.  Eat some foods each day that contain healthy fats, such as avocado, nuts, seeds, and fish. Lifestyle   Check your blood glucose regularly.  Exercise at least 30 minutes 5 or more days each week, or as told by your health care provider.  Take medicines as told by your health care provider.  Do not use any products that contain nicotine or tobacco, such as cigarettes and e-cigarettes. If you need help quitting, ask your health care  provider.  Work with a Social worker or diabetes educator to identify strategies to manage stress and any emotional and social challenges. What are some questions to ask my health care provider?  Do I need to meet with a diabetes educator?  Do I need to meet with a dietitian?  What number can I call if I have questions?  When are the best times to check my blood glucose? Where to find more information:  American Diabetes Association: diabetes.org/food-and-fitness/food  Academy of Nutrition and Dietetics: PokerClues.dk  [employer] of Diabetes and Digestive and Kidney Diseases (NIH): ContactWire.be Summary  A healthy meal plan will help you control your blood glucose and maintain a healthy lifestyle.  Working with a diet and nutrition specialist (dietitian) can help you make a meal plan that is best for you.  Keep in mind that carbohydrates and alcohol have immediate effects on your blood glucose levels. It is important to count carbohydrates and to use alcohol carefully. This information is not intended to replace advice given to you by your health care provider. Make sure you  discuss any questions you have with your health care provider. Document Released: 07/23/2005 Document Revised: 11/30/2016 Document Reviewed: 11/30/2016 Elsevier Interactive Patient Education  2018 [place].  Hemoglobin A1c Test Rather than testing your blood glucose level on one single day, the hemoglobin A1c (HbA1c) test measures the average amount of glycated hemoglobin and, therefore, the average amount of glucose in your blood during the 3-4 months just before the test is done. The HbA1c test is used to monitor long-term control of blood sugar in people who have diabetes mellitus. The HbA1c test can also be used in addition to or in combination with fasting blood glucose level and  oral glucose tolerance tests.  Range of Normal Values Ranges for normal values may vary among different labs and hospitals. You should always check with your health care provider after having lab work or other tests done to discuss the meaning of your test results and whether your values are considered within normal limits. The ranges for normal HbA1c test results are as follows:  Adult or child without diabetes: 4-5.9%.  Adult or child with diabetes and good blood glucose control: less than 6.5%.  Several factors can affect HbA1c test results. These may include:  Diseases (hemoglobinopathies) that cause a change in the shape, size, or amount of Hb in your blood.  Longer than normal RBC life span.  Abnormally low levels of certain proteins in your blood.  Eating foods or taking supplements that are high in vitamin C (ascorbic acid).  Meaning of Results Outside Normal Value Ranges Abnormally high HbA1c values are most commonly an indication of prediabetes mellitus and diabetes mellitus:  An HbA1c result of 5.7-6.4% is considered diagnostic of prediabetes mellitus.  An HbA1c result of 6.5% or higher on two separate occasions is considered diagnostic of diabetes mellitus.  Abnormally low HbA1c values can be caused by several health conditions. These may include:  Pregnancy.  A large amount of blood loss.  Blood transfusions.  Low red blood cell count (anemia). This is caused by premature destruction of red blood cells.  Long-term kidney failure.  Some unusual forms of Hb (Hb variants), such as sickle cell trait.  Thank you for coming in today. I hope you feel we met your needs.  Feel free to call PCP if you have any questions or further requests.  Please consider signing up for MyChart if you do not already have it, as this is a great way to communicate with me.  Best,  Whitney McVey, PA-C   IF you received an x-ray today, you will receive an invoice from [facility]  Radiology. Please contact [facility]iology at [phone] with questions or concerns regarding your invoice.   IF you received labwork today, you will receive an invoice from [place]. Please contact [facility] at [phone] with questions or concerns regarding your invoice.   Our billing staff will not be [place] to assist you with questions regarding bills from these companies.  You will be contacted with the lab results as soon as they are available. The fastest way to get your results is to activate your My Chart account. Instructions are located on the last page of this paperwork. If you have not heard from [place] regarding the results in 2 weeks, please contact this office.

## 2018-04-17 ENCOUNTER — Other Ambulatory Visit: Payer: Self-pay | Admitting: Physician Assistant

## 2018-06-14 ENCOUNTER — Other Ambulatory Visit: Payer: Self-pay | Admitting: Physician Assistant

## 2018-06-23 ENCOUNTER — Telehealth: Payer: Self-pay | Admitting: Physician Assistant

## 2018-06-23 NOTE — Telephone Encounter (Signed)
Copied from CRM [phone]. Topic: General - Other >> Jun 23, 2018  9:20 AM Gerrianne ScalePayne, Angela L wrote: Reason for CRM: pt was calling to let McVey know that in the mornings that his blood sugar goes up from 95-103 and in the evenings 120-123 and want to know if its normal for it to go up and down

## 2018-06-28 NOTE — Telephone Encounter (Signed)
Pt informed

## 2018-06-28 NOTE — Telephone Encounter (Signed)
This is normal and is nothing to be concerned about. Please advise. Pt. Thank you!

## 2018-06-28 NOTE — Telephone Encounter (Signed)
Please advise 

## 2018-12-09 ENCOUNTER — Encounter: Payer: Self-pay | Admitting: Emergency Medicine

## 2018-12-09 ENCOUNTER — Ambulatory Visit: Payer: BLUE CROSS/BLUE SHIELD | Admitting: Emergency Medicine

## 2018-12-09 ENCOUNTER — Other Ambulatory Visit: Payer: Self-pay

## 2018-12-09 VITALS — BP 116/74 | HR 62 | Temp 98.4°F | Resp 16 | Ht 70.0 in | Wt 162.0 lb

## 2018-12-09 DIAGNOSIS — R7303 Prediabetes: Secondary | ICD-10-CM | POA: Diagnosis not present

## 2018-12-09 DIAGNOSIS — Z13220 Encounter for screening for lipoid disorders: Secondary | ICD-10-CM | POA: Diagnosis not present

## 2018-12-09 DIAGNOSIS — Z1329 Encounter for screening for other suspected endocrine disorder: Secondary | ICD-10-CM | POA: Diagnosis not present

## 2018-12-09 DIAGNOSIS — Z130 Encounter for screening for diseases of the blood and blood-forming organs and certain disorders involving the immune mechanism: Secondary | ICD-10-CM

## 2018-12-09 DIAGNOSIS — Z23 Encounter for immunization: Secondary | ICD-10-CM | POA: Diagnosis not present

## 2018-12-09 DIAGNOSIS — Z0000 Encounter for general adult medical examination without abnormal findings: Secondary | ICD-10-CM

## 2018-12-09 DIAGNOSIS — Z1321 Encounter for screening for nutritional disorder: Secondary | ICD-10-CM

## 2018-12-09 DIAGNOSIS — Z0001 Encounter for general adult medical examination with abnormal findings: Secondary | ICD-10-CM

## 2018-12-09 DIAGNOSIS — Z13228 Encounter for screening for other metabolic disorders: Secondary | ICD-10-CM

## 2018-12-09 NOTE — Progress Notes (Signed)
Lab Results  Component Value Date   HGBA1C 6.2 03/02/2018   BP Readings from Last 3 Encounters:  12/09/18 116/74  03/02/18 118/74  11/01/17 118/72   Lab Results  Component Value Date   CHOL 207 (H) 08/13/2017   HDL 58 08/13/2017   LDLCALC 123 (H) 08/13/2017   TRIG 131 08/13/2017   CHOLHDL 3.6 08/13/2017

## 2018-12-09 NOTE — Progress Notes (Signed)
Mitchell Schmidt 43 y.o.   Chief Complaint  Patient presents with  . Annual Exam    HISTORY OF PRESENT ILLNESS: This is a 43 y.o. male here for his annual exam. Has a history of prediabetes.  On no medications.  No other chronic medical problems. Has no complaints or medical concerns today.   HPI   Prior to Admission medications   Medication Sig Start Date End Date Taking? Authorizing Provider  blood glucose meter kit and supplies Dispense based on patient and insurance preference. Use up to four times daily as directed. (FOR ICD-10 E10.9, E11.9). 11/01/17  Yes Posey Boyer, MD  CONTOUR NEXT TEST test strip TEST 4 TIMES DAILY AS DIRECTED 06/14/18  Yes McVey, Gelene Mink, PA-C    No Known Allergies  Patient Active Problem List   Diagnosis Date Noted  . Prediabetes 09/10/2014    Past Medical History:  Diagnosis Date  . Allergy   . Elevated LFTs   . Pre-diabetes     No past surgical history on file.  Social History   Socioeconomic History  . Marital status: Married    Spouse name: Not on file  . Number of children: Not on file  . Years of education: Not on file  . Highest education level: Not on file  Occupational History  . Not on file  Social Needs  . Financial resource strain: Not on file  . Food insecurity:    Worry: Not on file    Inability: Not on file  . Transportation needs:    Medical: Not on file    Non-medical: Not on file  Tobacco Use  . Smoking status: Never Smoker  . Smokeless tobacco: Never Used  Substance and Sexual Activity  . Alcohol use: Yes    Alcohol/week: 1.0 - 2.0 standard drinks    Types: 1 - 2 Standard drinks or equivalent per week  . Drug use: No  . Sexual activity: Not on file  Lifestyle  . Physical activity:    Days per week: Not on file    Minutes per session: Not on file  . Stress: Not on file  Relationships  . Social connections:    Talks on phone: Not on file    Gets together: Not on file    Attends religious  service: Not on file    Active member of club or organization: Not on file    Attends meetings of clubs or organizations: Not on file    Relationship status: Not on file  . Intimate partner violence:    Fear of current or ex partner: Not on file    Emotionally abused: Not on file    Physically abused: Not on file    Forced sexual activity: Not on file  Other Topics Concern  . Not on file  Social History Narrative  . Not on file    No family history on file.   Review of Systems  Constitutional: Negative.  Negative for chills and fever.  HENT: Negative.  Negative for congestion, nosebleeds and sore throat.   Eyes: Negative.  Negative for blurred vision and double vision.  Respiratory: Negative.  Negative for cough and shortness of breath.   Cardiovascular: Negative.  Negative for chest pain and palpitations.  Gastrointestinal: Negative.  Negative for abdominal pain, diarrhea, nausea and vomiting.  Genitourinary: Negative.  Negative for dysuria and hematuria.  Musculoskeletal: Negative.  Negative for back pain, myalgias and neck pain.  Skin: Negative.  Negative for rash.  Neurological: Negative.  Negative for dizziness and headaches.  Endo/Heme/Allergies: Negative.   All other systems reviewed and are negative.    Vitals:   12/09/18 0845  BP: 116/74  Pulse: 62  Resp: 16  Temp: 98.4 F (36.9 C)  SpO2: 100%     Physical Exam Vitals signs reviewed.  Constitutional:      Appearance: Normal appearance.  HENT:     Head: Normocephalic and atraumatic.     Nose: Nose normal.  Eyes:     Extraocular Movements: Extraocular movements intact.     Conjunctiva/sclera: Conjunctivae normal.     Pupils: Pupils are equal, round, and reactive to light.  Neck:     Musculoskeletal: Normal range of motion and neck supple.  Cardiovascular:     Rate and Rhythm: Normal rate and regular rhythm.     Pulses: Normal pulses.     Heart sounds: Normal heart sounds. No murmur.  Pulmonary:      Effort: Pulmonary effort is normal.     Breath sounds: Normal breath sounds.  Abdominal:     General: There is no distension.     Palpations: Abdomen is soft. There is no mass.     Tenderness: There is no abdominal tenderness.  Musculoskeletal: Normal range of motion.  Lymphadenopathy:     Cervical: No cervical adenopathy.  Skin:    General: Skin is warm.     Capillary Refill: Capillary refill takes less than 2 seconds.  Neurological:     General: No focal deficit present.     Mental Status: He is alert and oriented to person, place, and time.  Psychiatric:        Mood and Affect: Mood normal.        Behavior: Behavior normal.      ASSESSMENT & PLAN: [name] was seen today for annual exam.  Diagnoses and all orders for this visit:  Routine general medical examination at a health care facility -     Lipid panel -     Comprehensive metabolic panel  Prediabetes -     Lipid panel -     Comprehensive metabolic panel -     Microalbumin, urine -     HM Diabetes Foot Exam -     Hemoglobin A1c  Screening for endocrine, nutritional, metabolic and immunity disorder  Screening for lipoid disorders -     Lipid panel  Need for diphtheria-tetanus-pertussis (Tdap) vaccine -     Tdap vaccine greater than or equal to 7yo IM    Patient Instructions       If you have lab work done today you will be contacted with your lab results within the next 2 weeks.  If you have not heard from [place] then please contact us. The fastest way to get your results is to register for My Chart.   IF you received an x-ray today, you will receive an invoice from [facility]. Please contact [facility] at [phone] with questions or concerns regarding your invoice.   IF you received labwork today, you will receive an invoice from [facility]. Please contact [facility] at [phone] with questions or concerns regarding your invoice.   Our billing staff will not be [place] to assist you with  questions regarding bills from these companies.  You will be contacted with the lab results as soon as they are available. The fastest way to get your results is to activate your My Chart account. Instructions are located on the last page of this  paperwork. If you have not heard from [place] regarding the results in 2 weeks, please contact this office.      Health Maintenance, Male A healthy lifestyle and preventive care is important for your health and wellness. Ask your health care provider about what schedule of regular examinations is right for you. What should I know about weight and diet? Eat a Healthy Diet  Eat plenty of vegetables, fruits, whole grains, low-fat dairy products, and lean protein.  Do not eat a lot of foods high in solid fats, added sugars, or salt.  Maintain a Healthy Weight Regular exercise can help you achieve or maintain a healthy weight. You should:  Do at least 150 minutes of exercise each week. The exercise should increase your heart rate and make you sweat (moderate-intensity exercise).  Do strength-training exercises at least twice a week. Watch Your Levels of Cholesterol and Blood Lipids  Have your blood tested for lipids and cholesterol every 5 years starting at 44 years of age. If you are at high risk for heart disease, you should start having your blood tested when you are 44 years old. You may need to have your cholesterol levels checked more often if: ? Your lipid or cholesterol levels are high. ? You are older than 44 years of age. ? You are at high risk for heart disease. What should I know about cancer screening? Many types of cancers can be detected early and may often be prevented. Lung Cancer  You should be screened every year for lung cancer if: ? You are a current smoker who has smoked for at least 30 years. ? You are a former smoker who has quit within the past 15 years.  Talk to your health care provider about your screening options, when  you should start screening, and how often you should be screened. Colorectal Cancer  Routine colorectal cancer screening usually begins at 44 years of age and should be repeated every 5-10 years until you are 44 years old. You may need to be screened more often if early forms of precancerous polyps or small growths are found. Your health care provider may recommend screening at an earlier age if you have risk factors for colon cancer.  Your health care provider may recommend using home test kits to check for hidden blood in the stool.  A small camera at the end of a tube can be used to examine your colon (sigmoidoscopy or colonoscopy). This checks for the earliest forms of colorectal cancer. Prostate and Testicular Cancer  Depending on your age and overall health, your health care provider may do certain tests to screen for prostate and testicular cancer.  Talk to your health care provider about any symptoms or concerns you have about testicular or prostate cancer. Skin Cancer  Check your skin from head to toe regularly.  Tell your health care provider about any new moles or changes in moles, especially if: ? There is a change in a mole's size, shape, or color. ? You have a mole that is larger than a pencil eraser.  Always use sunscreen. Apply sunscreen liberally and repeat throughout the day.  Protect yourself by wearing long sleeves, pants, a wide-brimmed hat, and sunglasses when outside. What should I know about heart disease, diabetes, and high blood pressure?  If you are 49-10 years of age, have your blood pressure checked every 3-5 years. If you are 37 years of age or older, have your blood pressure checked every year. You should  have your blood pressure measured twice-once when you are at a hospital or clinic, and once when you are not at a hospital or clinic. Record the average of the two measurements. To check your blood pressure when you are not at a hospital or clinic, you can  use: ? An automated blood pressure machine at a pharmacy. ? A home blood pressure monitor.  Talk to your health care provider about your target blood pressure.  If you are between 64-35 years old, ask your health care provider if you should take aspirin to prevent heart disease.  Have regular diabetes screenings by checking your fasting blood sugar level. ? If you are at a normal weight and have a low risk for diabetes, have this test once every three years after the age of 44. ? If you are overweight and have a high risk for diabetes, consider being tested at a younger age or more often.  A one-time screening for abdominal aortic aneurysm (AAA) by ultrasound is recommended for men aged 44-75 years who are current or former smokers. What should I know about preventing infection? Hepatitis B If you have a higher risk for hepatitis B, you should be screened for this virus. Talk with your health care provider to find out if you are at risk for hepatitis B infection. Hepatitis C Blood testing is recommended for:  Everyone born from [birth year] through 1966.  Anyone with known risk factors for hepatitis C. Sexually Transmitted Diseases (STDs)  You should be screened each year for STDs including gonorrhea and chlamydia if: ? You are sexually active and are younger than 44 years of age. ? You are older than 44 years of age and your health care provider tells you that you are at risk for this type of infection. ? Your sexual activity has changed since you were last screened and you are at an increased risk for chlamydia or gonorrhea. Ask your health care provider if you are at risk.  Talk with your health care provider about whether you are at high risk of being infected with HIV. Your health care provider may recommend a prescription medicine to help prevent HIV infection. What else can I do?  Schedule regular health, dental, and eye exams.  Stay current with your vaccines (immunizations).  Do  not use any tobacco products, such as cigarettes, chewing tobacco, and e-cigarettes. If you need help quitting, ask your health care provider.  Limit alcohol intake to no more than 2 drinks per day. One drink equals 12 ounces of beer, 5 ounces of wine, or 1 ounces of hard liquor.  Do not use street drugs.  Do not share needles.  Ask your health care provider for help if you need support or information about quitting drugs.  Tell your health care provider if you often feel depressed.  Tell your health care provider if you have ever been abused or do not feel safe at home. This information is not intended to replace advice given to you by your health care provider. Make sure you discuss any questions you have with your health care provider. Document Released: 04/23/2008 Document Revised: 06/24/2016 Document Reviewed: 07/30/2015 Elsevier Interactive Patient Education  2019 Elsevier Inc.      Agustina Caroli, MD [facility]

## 2018-12-09 NOTE — Patient Instructions (Addendum)

## 2018-12-10 LAB — COMPREHENSIVE METABOLIC PANEL
ALBUMIN: 4.5 g/dL (ref 4.0–5.0)
ALT: 28 IU/L (ref 0–44)
AST: 27 IU/L (ref 0–40)
Albumin/Globulin Ratio: 1.7 (ref 1.2–2.2)
Alkaline Phosphatase: 72 IU/L (ref 39–117)
BUN / CREAT RATIO: 13 (ref 9–20)
BUN: 15 mg/dL (ref 6–24)
Bilirubin Total: 0.4 mg/dL (ref 0.0–1.2)
CALCIUM: 9.5 mg/dL (ref 8.7–10.2)
CO2: 24 mmol/L (ref 20–29)
CREATININE: 1.19 mg/dL (ref 0.76–1.27)
Chloride: 102 mmol/L (ref 96–106)
GFR, EST AFRICAN AMERICAN: 85 mL/min/{1.73_m2} (ref >59)
GFR, EST NON AFRICAN AMERICAN: 74 mL/min/{1.73_m2} (ref >59)
GLOBULIN, TOTAL: 2.6 g/dL (ref 1.5–4.5)
Glucose: 102 mg/dL — ABNORMAL HIGH (ref 65–99)
Potassium: 4.4 mmol/L (ref 3.5–5.2)
Sodium: 140 mmol/L (ref 134–144)
TOTAL PROTEIN: 7.1 g/dL (ref 6.0–8.5)

## 2018-12-10 LAB — LIPID PANEL
CHOLESTEROL TOTAL: 161 mg/dL (ref 100–199)
Chol/HDL Ratio: 3 ratio (ref 0.0–5.0)
HDL: 53 mg/dL (ref >39)
LDL Calculated: 92 mg/dL (ref 0–99)
TRIGLYCERIDES: 81 mg/dL (ref 0–149)
VLDL Cholesterol Cal: 16 mg/dL (ref 5–40)

## 2018-12-10 LAB — HEMOGLOBIN A1C
Est. average glucose Bld gHb Est-mCnc: 126 mg/dL
HEMOGLOBIN A1C: 6 % — AB (ref 4.8–5.6)

## 2018-12-10 LAB — MICROALBUMIN, URINE: Microalbumin, Urine: 13 ug/mL

## 2018-12-12 ENCOUNTER — Encounter: Payer: Self-pay | Admitting: *Deleted

## 2018-12-22 ENCOUNTER — Telehealth: Payer: Self-pay | Admitting: General Practice

## 2018-12-22 NOTE — Telephone Encounter (Signed)
Copied from CRM [id number]. Topic: General - Inquiry >> Dec 22, 2018  2:23 PM Baldo Daub L wrote: Reason for CRM:   Pt calling to get lab results from physical. Pt can be reached at [phone]

## 2018-12-26 NOTE — Telephone Encounter (Signed)
Patient is calling to check on the status of his labs Please advise

## 2018-12-27 NOTE — Telephone Encounter (Signed)
Called the and informed him of lab results. We have mailed him a letter along with the verbal.  Mitchell Schmidt

## 2019-08-07 ENCOUNTER — Ambulatory Visit: Payer: BLUE CROSS/BLUE SHIELD | Admitting: Emergency Medicine

## 2020-03-18 ENCOUNTER — Ambulatory Visit: Payer: Self-pay | Attending: Internal Medicine

## 2020-03-18 DIAGNOSIS — Z23 Encounter for immunization: Secondary | ICD-10-CM

## 2020-03-18 NOTE — Progress Notes (Signed)
   Covid-19 Vaccination Clinic  Name:  Mitchell Schmidt    MRN: [id number] DOB: 04-Apr-1976  03/18/2020  [name] was observed post Covid-19 immunization for 15 minutes without incident. He was provided with Vaccine Information Sheet and instruction to access the V-Safe system.   [name] was instructed to call 911 with any severe reactions post vaccine: Marland Kitchen Difficulty breathing  . Swelling of face and throat  . A fast heartbeat  . A bad rash all over body  . Dizziness and weakness   Immunizations Administered    Name Date Dose VIS Date Route   Pfizer COVID-19 Vaccine 03/18/2020 12:50 PM 0.3 mL 01/03/2019 Intramuscular   Manufacturer: [employer], [employer]   Lot: [id number]   NDC: [address]-3

## 2020-04-09 ENCOUNTER — Ambulatory Visit: Payer: Self-pay

## 2020-07-08 ENCOUNTER — Other Ambulatory Visit: Payer: Self-pay

## 2020-07-08 ENCOUNTER — Encounter: Payer: Self-pay | Admitting: Emergency Medicine

## 2020-07-08 ENCOUNTER — Ambulatory Visit (INDEPENDENT_AMBULATORY_CARE_PROVIDER_SITE_OTHER): Payer: 59 | Admitting: Emergency Medicine

## 2020-07-08 VITALS — BP 121/77 | HR 73 | Temp 98.0°F | Resp 16 | Ht 70.0 in | Wt 168.0 lb

## 2020-07-08 DIAGNOSIS — Z0000 Encounter for general adult medical examination without abnormal findings: Secondary | ICD-10-CM

## 2020-07-08 DIAGNOSIS — Z1329 Encounter for screening for other suspected endocrine disorder: Secondary | ICD-10-CM

## 2020-07-08 DIAGNOSIS — Z13220 Encounter for screening for lipoid disorders: Secondary | ICD-10-CM | POA: Diagnosis not present

## 2020-07-08 DIAGNOSIS — Z0001 Encounter for general adult medical examination with abnormal findings: Secondary | ICD-10-CM

## 2020-07-08 DIAGNOSIS — R7303 Prediabetes: Secondary | ICD-10-CM

## 2020-07-08 DIAGNOSIS — Z130 Encounter for screening for diseases of the blood and blood-forming organs and certain disorders involving the immune mechanism: Secondary | ICD-10-CM

## 2020-07-08 DIAGNOSIS — Z13228 Encounter for screening for other metabolic disorders: Secondary | ICD-10-CM

## 2020-07-08 NOTE — Patient Instructions (Signed)

## 2020-07-08 NOTE — Progress Notes (Addendum)
Mitchell Schmidt 44 y.o.   Chief Complaint  Patient presents with  . Annual Exam  . Hip Pain    per patient for 9 months after waking up    HISTORY OF PRESENT ILLNESS: This is a 44 y.o. male here for annual exam. Past medical history includes prediabetes. Healthy male with a healthy lifestyle. Also occasional lumbar pain mostly after waking up and getting better soon after moving around. No other complaints or medical concerns today. Fully vaccinated against Covid.  HPI   Prior to Admission medications   Medication Sig Start Date End Date Taking? Authorizing Provider  blood glucose meter kit and supplies Dispense based on patient and insurance preference. Use up to four times daily as directed. (FOR ICD-10 E10.9, E11.9). 11/01/17  Yes Posey Boyer, MD  CONTOUR NEXT TEST test strip TEST 4 TIMES DAILY AS DIRECTED 06/14/18  Yes McVey, Gelene Mink, PA-C    No Known Allergies  Patient Active Problem List   Diagnosis Date Noted  . Prediabetes 09/10/2014    Past Medical History:  Diagnosis Date  . Allergy   . Elevated LFTs   . Pre-diabetes     History reviewed. No pertinent surgical history.  Social History   Socioeconomic History  . Marital status: Married    Spouse name: Not on file  . Number of children: Not on file  . Years of education: Not on file  . Highest education level: Not on file  Occupational History  . Not on file  Tobacco Use  . Smoking status: Never Smoker  . Smokeless tobacco: Never Used  Substance and Sexual Activity  . Alcohol use: Yes    Alcohol/week: 1.0 - 2.0 standard drink    Types: 1 - 2 Standard drinks or equivalent per week  . Drug use: No  . Sexual activity: Not on file  Other Topics Concern  . Not on file  Social History Narrative  . Not on file   Social Determinants of Health   Financial Resource Strain:   . Difficulty of Paying Living Expenses: Not on file  Food Insecurity:   . Worried About Charity fundraiser in  the Last Year: Not on file  . Ran Out of Food in the Last Year: Not on file  Transportation Needs:   . Lack of Transportation (Medical): Not on file  . Lack of Transportation (Non-Medical): Not on file  Physical Activity:   . Days of Exercise per Week: Not on file  . Minutes of Exercise per Session: Not on file  Stress:   . Feeling of Stress : Not on file  Social Connections:   . Frequency of Communication with Friends and Family: Not on file  . Frequency of Social Gatherings with Friends and Family: Not on file  . Attends Religious Services: Not on file  . Active Member of Clubs or Organizations: Not on file  . Attends Archivist Meetings: Not on file  . Marital Status: Not on file  Intimate Partner Violence:   . Fear of Current or Ex-Partner: Not on file  . Emotionally Abused: Not on file  . Physically Abused: Not on file  . Sexually Abused: Not on file    History reviewed. No pertinent family history.   Review of Systems  Constitutional: Negative.  Negative for chills and fever.  HENT: Negative.  Negative for congestion and sore throat.   Respiratory: Negative.  Negative for cough and shortness of breath.   Cardiovascular: Negative.  Negative for chest pain and palpitations.  Gastrointestinal: Negative.  Negative for abdominal pain, blood in stool, diarrhea, melena, nausea and vomiting.  Genitourinary: Negative.  Negative for dysuria and hematuria.  Musculoskeletal: Positive for back pain (Lumbar). Negative for myalgias and neck pain.  Skin: Negative.  Negative for rash.  Neurological: Negative for dizziness and headaches.  All other systems reviewed and are negative.  Today's Vitals   07/08/20 0834  BP: 121/77  Pulse: 73  Resp: 16  Temp: 98 F (36.7 C)  TempSrc: Temporal  SpO2: 100%  Weight: 168 lb (76.2 kg)  Height: _0  (1.778 m)   Body mass index is 24.11 kg/m.   Physical Exam Vitals reviewed.  Constitutional:      Appearance: Normal  appearance.  HENT:     Head: Normocephalic.     Mouth/Throat:     Mouth: Mucous membranes are moist.     Pharynx: Oropharynx is clear.  Eyes:     Extraocular Movements: Extraocular movements intact.     Conjunctiva/sclera: Conjunctivae normal.     Pupils: Pupils are equal, round, and reactive to light.  Cardiovascular:     Rate and Rhythm: Normal rate and regular rhythm.     Pulses: Normal pulses.     Heart sounds: Normal heart sounds.  Pulmonary:     Effort: Pulmonary effort is normal.     Breath sounds: Normal breath sounds.  Abdominal:     General: Bowel sounds are normal. There is no distension.     Palpations: Abdomen is soft. There is no mass.     Tenderness: There is no abdominal tenderness.  Musculoskeletal:        General: Normal range of motion.     Cervical back: Normal range of motion and neck supple.     Right lower leg: Normal. No swelling or tenderness. No edema.     Left lower leg: Normal. No swelling or tenderness. No edema.  Skin:    General: Skin is warm and dry.     Capillary Refill: Capillary refill takes less than 2 seconds.  Neurological:     General: No focal deficit present.     Mental Status: He is alert and oriented to person, place, and time.  Psychiatric:        Mood and Affect: Mood normal.        Behavior: Behavior normal.      ASSESSMENT & PLAN: Mitchell Schmidt was seen today for annual exam and hip pain.  Diagnoses and all orders for this visit:  Routine general medical examination at a health care facility  Prediabetes -     Hemoglobin A1c -     Ambulatory referral to Ophthalmology  Screening for deficiency anemia -     CBC with Differential  Screening for lipoid disorders -     Lipid panel  Screening for endocrine, metabolic and immunity disorder -     Comprehensive metabolic panel    Patient Instructions    Health Maintenance, Male Adopting a healthy lifestyle and getting preventive care are important in promoting health and  wellness. Ask your health care provider about:  The right schedule for you to have regular tests and exams.  Things you can do on your own to prevent diseases and keep yourself healthy. What should I know about diet, weight, and exercise? Eat a healthy diet   Eat a diet that includes plenty of vegetables, fruits, low-fat dairy products, and lean protein.  Do not eat a lot  of foods that are high in solid fats, added sugars, or sodium. Maintain a healthy weight Body mass index (BMI) is a measurement that can be used to identify possible weight problems. It estimates body fat based on height and weight. Your health care provider can help determine your BMI and help you achieve or maintain a healthy weight. Get regular exercise Get regular exercise. This is one of the most important things you can do for your health. Most adults should:  Exercise for at least 150 minutes each week. The exercise should increase your heart rate and make you sweat (moderate-intensity exercise).  Do strengthening exercises at least twice a week. This is in addition to the moderate-intensity exercise.  Spend less time sitting. Even light physical activity can be beneficial. Watch cholesterol and blood lipids Have your blood tested for lipids and cholesterol at 45 years of age, then have this test every 5 years. You may need to have your cholesterol levels checked more often if:  Your lipid or cholesterol levels are high.  You are older than 45 years of age.  You are at high risk for heart disease. What should I know about cancer screening? Many types of cancers can be detected early and may often be prevented. Depending on your health history and family history, you may need to have cancer screening at various ages. This may include screening for:  Colorectal cancer.  Prostate cancer.  Skin cancer.  Lung cancer. What should I know about heart disease, diabetes, and high blood pressure? Blood pressure  and heart disease  High blood pressure causes heart disease and increases the risk of stroke. This is more likely to develop in people who have high blood pressure readings, are of African descent, or are overweight.  Talk with your health care provider about your target blood pressure readings.  Have your blood pressure checked: ? Every 3-5 years if you are 79-41 years of age. ? Every year if you are 86 years old or older.  If you are between the ages of 88 and 27 and are a current or former smoker, ask your health care provider if you should have a one-time screening for abdominal aortic aneurysm (AAA). Diabetes Have regular diabetes screenings. This checks your fasting blood sugar level. Have the screening done:  Once every three years after age 7 if you are at a normal weight and have a low risk for diabetes.  More often and at a younger age if you are overweight or have a high risk for diabetes. What should I know about preventing infection? Hepatitis B If you have a higher risk for hepatitis B, you should be screened for this virus. Talk with your health care provider to find out if you are at risk for hepatitis B infection. Hepatitis C Blood testing is recommended for:  Everyone born from [birth year] through 1966.  Anyone with known risk factors for hepatitis C. Sexually transmitted infections (STIs)  You should be screened each year for STIs, including gonorrhea and chlamydia, if: ? You are sexually active and are younger than 45 years of age. ? You are older than 45 years of age and your health care provider tells you that you are at risk for this type of infection. ? Your sexual activity has changed since you were last screened, and you are at increased risk for chlamydia or gonorrhea. Ask your health care provider if you are at risk.  Ask your health care provider about whether you are  at high risk for HIV. Your health care provider may recommend a prescription medicine to help  prevent HIV infection. If you choose to take medicine to prevent HIV, you should first get tested for HIV. You should then be tested every 3 months for as long as you are taking the medicine. Follow these instructions at home: Lifestyle  Do not use any products that contain nicotine or tobacco, such as cigarettes, e-cigarettes, and chewing tobacco. If you need help quitting, ask your health care provider.  Do not use street drugs.  Do not share needles.  Ask your health care provider for help if you need support or information about quitting drugs. Alcohol use  Do not drink alcohol if your health care provider tells you not to drink.  If you drink alcohol: ? Limit how much you have to 0-2 drinks a day. ? Be aware of how much alcohol is in your drink. In the [place] one drink equals one 12 oz bottle of beer (355 mL), one 5 oz glass of wine (148 mL), or one 1 oz glass of hard liquor (44 mL). General instructions  Schedule regular health, dental, and eye exams.  Stay current with your vaccines.  Tell your health care provider if: ? You often feel depressed. ? You have ever been abused or do not feel safe at home. Summary  Adopting a healthy lifestyle and getting preventive care are important in promoting health and wellness.  Follow your health care provider's instructions about healthy diet, exercising, and getting tested or screened for diseases.  Follow your health care provider's instructions on monitoring your cholesterol and blood pressure. This information is not intended to replace advice given to you by your health care provider. Make sure you discuss any questions you have with your health care provider. Document Revised: 10/19/2018 Document Reviewed: 10/19/2018 Elsevier Patient Education  2020 Elsevier Inc.      Agustina Caroli, MD [facility]

## 2020-07-09 ENCOUNTER — Other Ambulatory Visit: Payer: Self-pay | Admitting: Emergency Medicine

## 2020-07-09 DIAGNOSIS — R7303 Prediabetes: Secondary | ICD-10-CM

## 2020-07-09 LAB — LIPID PANEL
Chol/HDL Ratio: 3.6 ratio (ref 0.0–5.0)
Cholesterol, Total: 185 mg/dL (ref 100–199)
HDL: 52 mg/dL (ref >39)
LDL Chol Calc (NIH): 118 mg/dL — ABNORMAL HIGH (ref 0–99)
Triglycerides: 82 mg/dL (ref 0–149)
VLDL Cholesterol Cal: 15 mg/dL (ref 5–40)

## 2020-07-09 LAB — COMPREHENSIVE METABOLIC PANEL
ALT: 33 IU/L (ref 0–44)
AST: 28 IU/L (ref 0–40)
Albumin/Globulin Ratio: 1.6 (ref 1.2–2.2)
Albumin: 4.5 g/dL (ref 4.0–5.0)
Alkaline Phosphatase: 90 IU/L (ref 48–121)
BUN/Creatinine Ratio: 11 (ref 9–20)
BUN: 13 mg/dL (ref 6–24)
Bilirubin Total: 0.2 mg/dL (ref 0.0–1.2)
CO2: 24 mmol/L (ref 20–29)
Calcium: 9.3 mg/dL (ref 8.7–10.2)
Chloride: 103 mmol/L (ref 96–106)
Creatinine, Ser: 1.19 mg/dL (ref 0.76–1.27)
GFR calc Af Amer: 85 mL/min/{1.73_m2} (ref >59)
GFR calc non Af Amer: 73 mL/min/{1.73_m2} (ref >59)
Globulin, Total: 2.8 g/dL (ref 1.5–4.5)
Glucose: 130 mg/dL — ABNORMAL HIGH (ref 65–99)
Potassium: 4.3 mmol/L (ref 3.5–5.2)
Sodium: 141 mmol/L (ref 134–144)
Total Protein: 7.3 g/dL (ref 6.0–8.5)

## 2020-07-09 LAB — CBC WITH DIFFERENTIAL/PLATELET
Basophils Absolute: 0 10*3/uL (ref 0.0–0.2)
Basos: 1 %
EOS (ABSOLUTE): 0.4 10*3/uL (ref 0.0–0.4)
Eos: 8 %
Hematocrit: 40.4 % (ref 37.5–51.0)
Hemoglobin: 13.8 g/dL (ref 13.0–17.7)
Immature Grans (Abs): 0 10*3/uL (ref 0.0–0.1)
Immature Granulocytes: 0 %
Lymphocytes Absolute: 2.3 10*3/uL (ref 0.7–3.1)
Lymphs: 46 %
MCH: 31.9 pg (ref 26.6–33.0)
MCHC: 34.2 g/dL (ref 31.5–35.7)
MCV: 93 fL (ref 79–97)
Monocytes Absolute: 0.4 10*3/uL (ref 0.1–0.9)
Monocytes: 8 %
Neutrophils Absolute: 1.8 10*3/uL (ref 1.4–7.0)
Neutrophils: 37 %
Platelets: 259 10*3/uL (ref 150–450)
RBC: 4.33 x10E6/uL (ref 4.14–5.80)
RDW: 13.4 % (ref 11.6–15.4)
WBC: 4.9 10*3/uL (ref 3.4–10.8)

## 2020-07-09 LAB — HEMOGLOBIN A1C
Est. average glucose Bld gHb Est-mCnc: 146 mg/dL
Hgb A1c MFr Bld: 6.7 % — ABNORMAL HIGH (ref 4.8–5.6)

## 2020-07-09 MED ORDER — METFORMIN HCL 500 MG PO TABS: 500.0000 mg | ORAL_TABLET | Freq: Two times a day (BID) | ORAL | 3 refills | Status: DC

## 2021-07-08 ENCOUNTER — Other Ambulatory Visit: Payer: Self-pay | Admitting: Emergency Medicine

## 2021-07-08 DIAGNOSIS — R7303 Prediabetes: Secondary | ICD-10-CM

## 2023-09-17 ENCOUNTER — Encounter: Payer: Self-pay | Admitting: Nurse Practitioner

## 2023-09-17 ENCOUNTER — Ambulatory Visit (INDEPENDENT_AMBULATORY_CARE_PROVIDER_SITE_OTHER): Payer: 59 | Admitting: Nurse Practitioner

## 2023-09-17 VITALS — BP 128/82 | HR 73 | Ht 70.0 in | Wt 166.6 lb

## 2023-09-17 DIAGNOSIS — Z13220 Encounter for screening for lipoid disorders: Secondary | ICD-10-CM

## 2023-09-17 DIAGNOSIS — R7303 Prediabetes: Secondary | ICD-10-CM

## 2023-09-17 LAB — POCT GLYCOSYLATED HEMOGLOBIN (HGB A1C): HbA1c, POC (prediabetic range): 6.9 % — AB (ref 5.7–6.4)

## 2023-09-17 MED ORDER — METFORMIN HCL 500 MG PO TABS: 500.0000 mg | ORAL_TABLET | Freq: Two times a day (BID) | ORAL | 2 refills | Status: DC

## 2023-09-17 MED ORDER — METFORMIN HCL 500 MG PO TABS: 500.0000 mg | ORAL_TABLET | Freq: Two times a day (BID) | ORAL | 0 refills | Status: DC

## 2023-09-17 NOTE — Patient Instructions (Addendum)
1. Prediabetes  - POCT glycosylated hemoglobin (Hb A1C) - metFORMIN (GLUCOPHAGE) 500 MG tablet; Take 1 tablet (500 mg total) by mouth 2 (two) times daily with a meal. Needs OV for further refills  Dispense: 180 tablet; Refill: 0 - CBC - Comprehensive metabolic panel  2. Lipid screening  - Lipid Panel  Follow up:  Follow up in 6 months

## 2023-09-17 NOTE — Progress Notes (Signed)
Subjective   Patient ID: Mitchell Schmidt, male    DOB: January 25, 1976, 47 y.o.   MRN: [id number]  Chief Complaint  Patient presents with   New Patient (Initial Visit)    Referring provider: Georgina Quint, *  Mitchell Schmidt is a 47 y.o. male with Past Medical History: No date: Allergy No date: Elevated LFTs No date: Pre-diabetes   HPI  Patient presents today to establish care.  He has not been seen by PCP in a couple years.  His PCP office closed today on.  He states that he has been out of metformin for the past 2 months.  He was previously diagnosed with prediabetes.  Today his A1c was 6.9.  We will refill medications today. Denies f/c/s, n/v/d, hemoptysis, PND, leg swelling Denies chest pain or edema     No Known Allergies  Immunization History  Administered Date(s) Administered   PFIZER(Purple Top)SARS-COV-2 Vaccination 03/18/2020, 04/10/2020   Tdap 12/09/2018    Tobacco History: Social History   Tobacco Use  Smoking Status Never  Smokeless Tobacco Never   Counseling given: Not Answered   Outpatient Encounter Medications as of 09/17/2023  Medication Sig   blood glucose meter kit and supplies Dispense based on patient and insurance preference. Use up to four times daily as directed. (FOR ICD-10 E10.9, E11.9). (Patient not taking: Reported on 09/17/2023)   CONTOUR NEXT TEST test strip TEST 4 TIMES DAILY AS DIRECTED (Patient not taking: Reported on 09/17/2023)   metFORMIN (GLUCOPHAGE) 500 MG tablet Take 1 tablet (500 mg total) by mouth 2 (two) times daily with a meal.   [DISCONTINUED] metFORMIN (GLUCOPHAGE) 500 MG tablet Take 1 tablet (500 mg total) by mouth 2 (two) times daily with a meal. Needs OV for further refills   [DISCONTINUED] metFORMIN (GLUCOPHAGE) 500 MG tablet Take 1 tablet (500 mg total) by mouth 2 (two) times daily with a meal. Needs OV for further refills   No facility-administered encounter medications on file as of 09/17/2023.    Review of  Systems  Review of Systems  Constitutional: Negative.   HENT: Negative.    Cardiovascular: Negative.   Gastrointestinal: Negative.   Allergic/Immunologic: Negative.   Neurological: Negative.   Psychiatric/Behavioral: Negative.       Objective:   BP 128/82   Pulse 73   Ht 5\' 10"  (1.778 m)   Wt 166 lb 9.6 oz (75.6 kg)   SpO2 100%   BMI 23.90 kg/m   Wt Readings from Last 5 Encounters:  09/17/23 166 lb 9.6 oz (75.6 kg)  07/08/20 168 lb (76.2 kg)  12/09/18 162 lb (73.5 kg)  03/02/18 163 lb 6.4 oz (74.1 kg)  11/01/17 167 lb (75.8 kg)     Physical Exam Vitals and nursing note reviewed.  Constitutional:      General: He is not in acute distress.    Appearance: He is well-developed.  Cardiovascular:     Rate and Rhythm: Normal rate and regular rhythm.  Pulmonary:     Effort: Pulmonary effort is normal.     Breath sounds: Normal breath sounds.  Skin:    General: Skin is warm and dry.  Neurological:     Mental Status: He is alert and oriented to person, place, and time.       Assessment & Plan:   Prediabetes -     POCT glycosylated hemoglobin (Hb A1C) -     CBC -     Comprehensive metabolic panel -     metFORMIN HCl;  Take 1 tablet (500 mg total) by mouth 2 (two) times daily with a meal.  Dispense: 180 tablet; Refill: 2  Lipid screening -     Lipid panel     Return in about 6 months (around 03/16/2024).   Ivonne Andrew, NP 09/17/2023

## 2023-09-18 LAB — CBC
Hematocrit: 39.5 % (ref 37.5–51.0)
Hemoglobin: 13.3 g/dL (ref 13.0–17.7)
MCH: 32.1 pg (ref 26.6–33.0)
MCHC: 33.7 g/dL (ref 31.5–35.7)
MCV: 95 fL (ref 79–97)
Platelets: 270 10*3/uL (ref 150–450)
RBC: 4.14 x10E6/uL (ref 4.14–5.80)
RDW: 13.1 % (ref 11.6–15.4)
WBC: 5.1 10*3/uL (ref 3.4–10.8)

## 2023-09-18 LAB — COMPREHENSIVE METABOLIC PANEL
ALT: 28 [IU]/L (ref 0–44)
AST: 22 [IU]/L (ref 0–40)
Albumin: 4.4 g/dL (ref 4.1–5.1)
Alkaline Phosphatase: 89 [IU]/L (ref 44–121)
BUN/Creatinine Ratio: 13 (ref 9–20)
BUN: 14 mg/dL (ref 6–24)
Bilirubin Total: 0.3 mg/dL (ref 0.0–1.2)
CO2: 24 mmol/L (ref 20–29)
Calcium: 9.6 mg/dL (ref 8.7–10.2)
Chloride: 102 mmol/L (ref 96–106)
Creatinine, Ser: 1.05 mg/dL (ref 0.76–1.27)
Globulin, Total: 2.9 g/dL (ref 1.5–4.5)
Glucose: 111 mg/dL — ABNORMAL HIGH (ref 70–99)
Potassium: 4.3 mmol/L (ref 3.5–5.2)
Sodium: 142 mmol/L (ref 134–144)
Total Protein: 7.3 g/dL (ref 6.0–8.5)
eGFR: 88 mL/min/{1.73_m2} (ref >59)

## 2023-09-18 LAB — LIPID PANEL
Chol/HDL Ratio: 3.4 ratio (ref 0.0–5.0)
Cholesterol, Total: 195 mg/dL (ref 100–199)
HDL: 57 mg/dL (ref >39)
LDL Chol Calc (NIH): 122 mg/dL — ABNORMAL HIGH (ref 0–99)
Triglycerides: 88 mg/dL (ref 0–149)
VLDL Cholesterol Cal: 16 mg/dL (ref 5–40)

## 2023-10-12 ENCOUNTER — Ambulatory Visit: Payer: 59 | Admitting: Nurse Practitioner

## 2023-10-12 ENCOUNTER — Other Ambulatory Visit (HOSPITAL_COMMUNITY): Payer: Self-pay

## 2023-10-12 ENCOUNTER — Encounter: Payer: Self-pay | Admitting: Nurse Practitioner

## 2023-10-12 VITALS — BP 120/83 | HR 78 | Temp 98.0°F | Resp 14 | Ht 70.0 in | Wt 166.0 lb

## 2023-10-12 DIAGNOSIS — E785 Hyperlipidemia, unspecified: Secondary | ICD-10-CM | POA: Diagnosis not present

## 2023-10-12 DIAGNOSIS — E1165 Type 2 diabetes mellitus with hyperglycemia: Secondary | ICD-10-CM | POA: Diagnosis not present

## 2023-10-12 HISTORY — DX: Type 2 diabetes mellitus with hyperglycemia: E11.65

## 2023-10-12 HISTORY — DX: Hyperlipidemia, unspecified: E78.5

## 2023-10-12 MED ORDER — METFORMIN HCL 500 MG PO TABS
500.0000 mg | ORAL_TABLET | Freq: Two times a day (BID) | ORAL | 2 refills | Status: DC
Start: 2023-10-12 — End: 2024-03-16
  Filled 2023-10-12: qty 180, 90d supply, fill #0

## 2023-10-12 MED ORDER — BLOOD GLUCOSE MONITOR SYSTEM W/DEVICE KIT
1.0000 | PACK | Freq: Three times a day (TID) | 0 refills | Status: AC
  Filled 2023-10-12: qty 1, 30d supply, fill #0

## 2023-10-12 MED ORDER — LANCET DEVICE MISC
1.0000 | Freq: Three times a day (TID) | 0 refills | Status: AC
  Filled 2023-10-12: qty 1, 30d supply, fill #0

## 2023-10-12 MED ORDER — BLOOD GLUCOSE TEST VI STRP
1.0000 | ORAL_STRIP | Freq: Three times a day (TID) | 0 refills | Status: AC
Start: 2023-10-12 — End: 2024-07-03
  Filled 2023-10-12: qty 50, 17d supply, fill #0
  Filled 2024-06-16 (×3): qty 50, 17d supply, fill #1

## 2023-10-12 MED ORDER — GLUCOSE BLOOD VI STRP
ORAL_STRIP | 0 refills | Status: DC
  Filled 2023-10-12: qty 100, 33d supply, fill #0

## 2023-10-12 MED ORDER — ACCU-CHEK SOFTCLIX LANCETS MISC
1.0000 | Freq: Three times a day (TID) | 0 refills | Status: AC
  Filled 2023-10-12: qty 100, 30d supply, fill #0

## 2023-10-12 NOTE — Assessment & Plan Note (Signed)
Lab Results  Component Value Date   HGBA1C 6.9 (A) 09/17/2023  Metformin 500 mg twice daily sent to the [facility] Patient counseled on low-carb modified diet CBG goals discussed Encouraged to engage in regular moderate exercise at least 150 minutes weekly Need for diabetic eye exam discussed, needs referral when he returns from [place] Urine microalbumin labs obtained today Not on a statin but he he is traveling tomorrow we discussed following up in the office when he returns to the country so he can be started on an appropriate statin Glucometer and supply ordered

## 2023-10-12 NOTE — Progress Notes (Signed)
Acute Office Visit  Subjective:     Patient ID: Mitchell Schmidt, male    DOB: Oct 03, 1976, 47 y.o.   MRN: [id number]  Chief Complaint  Patient presents with   Blood Sugar Problem    HPI Mitchell Schmidt  has a past medical history of Allergy, Dyslipidemia, goal LDL below 70 (10/12/2023), Elevated LFTs, Pre-diabetes, Prediabetes (09/10/2014), and Type 2 diabetes mellitus with hyperglycemia, without long-term current use of insulin (HCC) (10/12/2023).  Patient presents for follow-up for diabetes.  Patient was seen at his office on 09/17/2023.  Metformin 500 mg twice daily was refilled but the patient stated that he was not Mitchell Schmidt to pick up the medication at his pharmacy, he is also not checking his blood sugar because he is out of test strips.  He plans on traveling to [place] tomorrow and will be due for 3 months.  He reports polyuria, no polyphagia polydipsia. patient denies fever, chills, chest pain, shortness of breath nausea vomiting diarrhea    Review of Systems  Constitutional:  Negative for appetite change, chills, fatigue and fever.  HENT:  Negative for congestion, postnasal drip, rhinorrhea and sneezing.   Respiratory:  Negative for cough, shortness of breath and wheezing.   Cardiovascular:  Negative for chest pain, palpitations and leg swelling.  Gastrointestinal:  Negative for abdominal pain, constipation, nausea and vomiting.  Genitourinary:  Negative for difficulty urinating, dysuria, flank pain and frequency.  Musculoskeletal:  Negative for arthralgias, back pain, joint swelling and myalgias.  Skin:  Negative for color change, pallor, rash and wound.  Neurological:  Negative for dizziness, facial asymmetry, weakness, numbness and headaches.  Psychiatric/Behavioral:  Negative for behavioral problems, confusion, self-injury and suicidal ideas.         Objective:    BP 120/83 (BP Location: Left Arm, Patient Position: Sitting, Cuff Size: Normal)   Pulse 78   Temp 98 F (36.7 C)    Resp 14   Ht 5\' 10"  (1.778 m)   Wt 166 lb (75.3 kg)   SpO2 100%   BMI 23.82 kg/m    Physical Exam Vitals and nursing note reviewed.  Constitutional:      General: He is not in acute distress.    Appearance: Normal appearance. He is not ill-appearing, toxic-appearing or diaphoretic.  HENT:     Mouth/Throat:     Mouth: Mucous membranes are moist.     Pharynx: Oropharynx is clear. No oropharyngeal exudate or posterior oropharyngeal erythema.  Eyes:     General: No scleral icterus.       Right eye: No discharge.        Left eye: No discharge.     Extraocular Movements: Extraocular movements intact.     Conjunctiva/sclera: Conjunctivae normal.  Cardiovascular:     Rate and Rhythm: Normal rate and regular rhythm.     Pulses: Normal pulses.     Heart sounds: Normal heart sounds. No murmur heard.    No friction rub. No gallop.  Pulmonary:     Effort: Pulmonary effort is normal. No respiratory distress.     Breath sounds: Normal breath sounds. No stridor. No wheezing, rhonchi or rales.  Chest:     Chest wall: No tenderness.  Abdominal:     General: There is no distension.     Palpations: Abdomen is soft.     Tenderness: There is no abdominal tenderness. There is no right CVA tenderness, left CVA tenderness or guarding.  Musculoskeletal:        General: No  swelling, tenderness, deformity or signs of injury.     Right lower leg: No edema.     Left lower leg: No edema.  Skin:    General: Skin is warm and dry.     Capillary Refill: Capillary refill takes less than 2 seconds.     Coloration: Skin is not jaundiced or pale.     Findings: No bruising, erythema or lesion.  Neurological:     Mental Status: He is alert and oriented to person, place, and time.     Motor: No weakness.     Coordination: Coordination normal.     Gait: Gait normal.  Psychiatric:        Mood and Affect: Mood normal.        Behavior: Behavior normal.        Thought Content: Thought content normal.         Judgment: Judgment normal.     No results found for any visits on 10/12/23.      Assessment & Plan:   Problem List Items Addressed This Visit       Endocrine   Type 2 diabetes mellitus with hyperglycemia, without long-term current use of insulin (HCC) - Primary    Lab Results  Component Value Date   HGBA1C 6.9 (A) 09/17/2023  Metformin 500 mg twice daily sent to the [facility] pharmacy Patient counseled on low-carb modified diet CBG goals discussed Encouraged to engage in regular moderate exercise at least 150 minutes weekly Need for diabetic eye exam discussed, needs referral when he returns from [place] Urine microalbumin labs obtained today Not on a statin but he he is traveling tomorrow we discussed following up in the office when he returns to the country so he can be started on an appropriate statin Glucometer and supply ordered      Relevant Medications   metFORMIN (GLUCOPHAGE) 500 MG tablet   Blood Glucose Monitoring Suppl (BLOOD GLUCOSE MONITOR SYSTEM) w/Device KIT   Glucose Blood (BLOOD GLUCOSE TEST STRIPS) STRP   Lancet Device MISC   Accu-Chek Softclix Lancets lancets   Other Relevant Orders   Microalbumin / creatinine urine ratio     Other   Dyslipidemia, goal LDL below 70    Lab Results  Component Value Date   CHOL 195 09/17/2023   HDL 57 09/17/2023   LDLCALC 122 (H) 09/17/2023   TRIG 88 09/17/2023   CHOLHDL 3.4 09/17/2023  LDL goal is less than 70, currently not on a statin Patient is traveling tomorrow will be gone for 3 months He was encouraged to avoid fatty fried foods Follow-up in the office after he returns from [place], needs to start a statin       Meds ordered this encounter  Medications   metFORMIN (GLUCOPHAGE) 500 MG tablet    Sig: Take 1 tablet (500 mg total) by mouth 2 (two) times daily with a meal.    Dispense:  180 tablet    Refill:  2   Blood Glucose Monitoring Suppl (BLOOD GLUCOSE MONITOR SYSTEM) w/Device KIT    Sig: Use to  check blood glucose in the morning, at noon, and at bedtime.    Dispense:  1 kit    Refill:  0   Glucose Blood (BLOOD GLUCOSE TEST STRIPS) STRP    Sig: Use to check blood glucose in the morning, at noon, and at bedtime.    Dispense:  100 strip    Refill:  0   Lancet Device MISC  Sig: Use to check blood glucose in the morning, at noon, and at bedtime.    Dispense:  1 each    Refill:  0   Accu-Chek Softclix Lancets lancets    Sig: Use to check blood glucose in the morning, at noon, and at bedtime.    Dispense:  100 each    Refill:  0    No follow-ups on file.  Donell Beers, FNP

## 2023-10-12 NOTE — Assessment & Plan Note (Signed)
Lab Results  Component Value Date   CHOL 195 09/17/2023   HDL 57 09/17/2023   LDLCALC 122 (H) 09/17/2023   TRIG 88 09/17/2023   CHOLHDL 3.4 09/17/2023  LDL goal is less than 70, currently not on a statin Patient is traveling tomorrow will be gone for 3 months He was encouraged to avoid fatty fried foods Follow-up in the office after he returns from [place], needs to start a statin

## 2023-10-12 NOTE — Patient Instructions (Addendum)
Goal for fasting blood sugar ranges from 80 to 120 and 2 hours after any meal or at bedtime should be between 130 to 170.    1. Type 2 diabetes mellitus with hyperglycemia, without long-term current use of insulin (HCC)  - metFORMIN (GLUCOPHAGE) 500 MG tablet; Take 1 tablet (500 mg total) by mouth 2 (two) times daily with a meal.  Dispense: 180 tablet; Refill: 2 - Blood Glucose Monitoring Suppl DEVI; 1 each by Does not apply route in the morning, at noon, and at bedtime. May substitute to any manufacturer covered by patient's insurance.  Dispense: 1 each; Refill: 0 - Glucose Blood (BLOOD GLUCOSE TEST STRIPS) STRP; 1 each by In Vitro route in the morning, at noon, and at bedtime. May substitute to any manufacturer covered by patient's insurance.  Dispense: 100 strip; Refill: 0 - Lancet Device MISC; 1 each by Does not apply route in the morning, at noon, and at bedtime. May substitute to any manufacturer covered by patient's insurance.  Dispense: 1 each; Refill: 0 - Lancets Misc. MISC; 1 each by Does not apply route in the morning, at noon, and at bedtime. May substitute to any manufacturer covered by patient's insurance.  Dispense: 100 each; Refill: 0   It is important that you exercise regularly at least 30 minutes 5 times a week as tolerated  Think about what you will eat, plan ahead. Choose " clean, green, fresh or frozen" over canned, processed or packaged foods which are more sugary, salty and fatty. 70 to 75% of food eaten should be vegetables and fruit. Three meals at set times with snacks allowed between meals, but they must be fruit or vegetables. Aim to eat over a 12 hour period , example 7 am to 7 pm, and STOP after  your last meal of the day. Drink water,generally about 64 ounces per day, no other drink is as healthy. Fruit juice is best enjoyed in a healthy way, by EATING the fruit.  Thanks for choosing [facility] we consider it a privelige to serve you.

## 2023-10-13 LAB — MICROALBUMIN / CREATININE URINE RATIO
Creatinine, Urine: 135.4 mg/dL
Microalb/Creat Ratio: 25 mg/g{creat} (ref 0–29)
Microalbumin, Urine: 34.2 ug/mL

## 2023-10-25 ENCOUNTER — Other Ambulatory Visit (HOSPITAL_COMMUNITY): Payer: Self-pay

## 2024-03-16 ENCOUNTER — Ambulatory Visit: Payer: Self-pay | Admitting: Nurse Practitioner

## 2024-03-16 ENCOUNTER — Encounter: Payer: Self-pay | Admitting: Nurse Practitioner

## 2024-03-16 ENCOUNTER — Ambulatory Visit: Admitting: Nurse Practitioner

## 2024-03-16 ENCOUNTER — Other Ambulatory Visit: Payer: Self-pay

## 2024-03-16 VITALS — BP 128/81 | HR 76 | Temp 98.2°F | Wt 165.4 lb

## 2024-03-16 DIAGNOSIS — R7303 Prediabetes: Secondary | ICD-10-CM

## 2024-03-16 DIAGNOSIS — Z13220 Encounter for screening for lipoid disorders: Secondary | ICD-10-CM

## 2024-03-16 DIAGNOSIS — E1165 Type 2 diabetes mellitus with hyperglycemia: Secondary | ICD-10-CM | POA: Diagnosis not present

## 2024-03-16 LAB — POCT GLYCOSYLATED HEMOGLOBIN (HGB A1C): Hemoglobin A1C: 6.6 % — AB (ref 4.0–5.6)

## 2024-03-16 MED ORDER — METFORMIN HCL 500 MG PO TABS: 500.0000 mg | ORAL_TABLET | Freq: Two times a day (BID) | ORAL | 2 refills | Status: AC

## 2024-03-16 NOTE — Progress Notes (Signed)
 Subjective   Patient ID: Mitchell Schmidt, male    DOB: November 24, 1975, 48 y.o.   MRN: [id number]  Chief Complaint  Patient presents with   Follow-up    Referring provider: Jerrlyn Morel, NP  Jeven Teply is a 49 y.o. male with Past Medical History: No date: Allergy 10/12/2023: Dyslipidemia, goal LDL below 70 No date: Elevated LFTs No date: Pre-diabetes 09/10/2014: Prediabetes 10/12/2023: Type 2 diabetes mellitus with hyperglycemia, without long- term current use of insulin (HCC)   HPI  Mitchell Schmidt  has a past medical history of Allergy, Dyslipidemia, goal LDL below 70 (10/12/2023), Elevated LFTs, Pre-diabetes, Prediabetes (09/10/2014), and Type 2 diabetes mellitus with hyperglycemia, without long-term current use of insulin (HCC) (10/12/2023).  Patient presents for follow-up for diabetes. Metformin  500 mg twice daily will be refilled.    He reports polyuria, no polyphagia polydipsia. patient denies fever, chills, chest pain, shortness of breath nausea vomiting diarrhea    No Known Allergies  Immunization History  Administered Date(s) Administered   PFIZER(Purple Top)SARS-COV-2 Vaccination 03/18/2020, 04/10/2020   Tdap 12/09/2018    Tobacco History: Social History   Tobacco Use  Smoking Status Never  Smokeless Tobacco Never   Counseling given: Not Answered   Outpatient Encounter Medications as of 03/16/2024  Medication Sig   blood glucose meter kit and supplies Dispense based on patient and insurance preference. Use up to four times daily as directed. (FOR ICD-10 E10.9, E11.9).   Blood Glucose Monitoring Suppl (BLOOD GLUCOSE MONITOR SYSTEM) w/Device KIT Use to check blood glucose in the morning, at noon, and at bedtime.   glucose blood test strip Use as directed in the morning, at noon and at bedtime.   [DISCONTINUED] metFORMIN  (GLUCOPHAGE ) 500 MG tablet Take 1 tablet (500 mg total) by mouth 2 (two) times daily with a meal.   metFORMIN  (GLUCOPHAGE ) 500 MG tablet Take 1  tablet (500 mg total) by mouth 2 (two) times daily with a meal.   No facility-administered encounter medications on file as of 03/16/2024.    Review of Systems  Review of Systems  Constitutional: Negative.   HENT: Negative.    Cardiovascular: Negative.   Gastrointestinal: Negative.   Allergic/Immunologic: Negative.   Neurological: Negative.   Psychiatric/Behavioral: Negative.       Objective:   BP 128/81   Pulse 76   Temp 98.2 F (36.8 C) (Oral)   Wt 165 lb 6.4 oz (75 kg)   SpO2 99%   BMI 23.73 kg/m   Wt Readings from Last 5 Encounters:  03/16/24 165 lb 6.4 oz (75 kg)  10/12/23 166 lb (75.3 kg)  09/17/23 166 lb 9.6 oz (75.6 kg)  07/08/20 168 lb (76.2 kg)  12/09/18 162 lb (73.5 kg)     Physical Exam Vitals and nursing note reviewed.  Constitutional:      General: He is not in acute distress.    Appearance: He is well-developed.  Cardiovascular:     Rate and Rhythm: Normal rate and regular rhythm.  Pulmonary:     Effort: Pulmonary effort is normal.     Breath sounds: Normal breath sounds.  Skin:    General: Skin is warm and dry.  Neurological:     Mental Status: He is alert and oriented to person, place, and time.       Assessment & Plan:   Type 2 diabetes mellitus with hyperglycemia, without long-term current use of insulin (HCC) -     POCT glycosylated hemoglobin (Hb A1C) -  CBC -     Comprehensive metabolic panel with GFR -     metFORMIN  HCl; Take 1 tablet (500 mg total) by mouth 2 (two) times daily with a meal.  Dispense: 180 tablet; Refill: 2  Prediabetes -     POCT glycosylated hemoglobin (Hb A1C)  Lipid screening -     Lipid panel     Return in about 3 months (around 06/16/2024).   Jerrlyn Morel, NP 03/16/2024

## 2024-03-17 LAB — CBC
Hematocrit: 40.2 % (ref 37.5–51.0)
Hemoglobin: 13.8 g/dL (ref 13.0–17.7)
MCH: 31.6 pg (ref 26.6–33.0)
MCHC: 34.3 g/dL (ref 31.5–35.7)
MCV: 92 fL (ref 79–97)
Platelets: 258 10*3/uL (ref 150–450)
RBC: 4.37 x10E6/uL (ref 4.14–5.80)
RDW: 12.7 % (ref 11.6–15.4)
WBC: 4.8 10*3/uL (ref 3.4–10.8)

## 2024-03-17 LAB — LIPID PANEL
Chol/HDL Ratio: 3.4 ratio (ref 0.0–5.0)
Cholesterol, Total: 178 mg/dL (ref 100–199)
HDL: 52 mg/dL (ref >39)
LDL Chol Calc (NIH): 109 mg/dL — ABNORMAL HIGH (ref 0–99)
Triglycerides: 90 mg/dL (ref 0–149)
VLDL Cholesterol Cal: 17 mg/dL (ref 5–40)

## 2024-03-17 LAB — COMPREHENSIVE METABOLIC PANEL WITH GFR
ALT: 32 IU/L (ref 0–44)
AST: 25 IU/L (ref 0–40)
Albumin: 4.8 g/dL (ref 4.1–5.1)
Alkaline Phosphatase: 82 IU/L (ref 44–121)
BUN/Creatinine Ratio: 13 (ref 9–20)
BUN: 15 mg/dL (ref 6–24)
Bilirubin Total: 0.5 mg/dL (ref 0.0–1.2)
CO2: 25 mmol/L (ref 20–29)
Calcium: 9.8 mg/dL (ref 8.7–10.2)
Chloride: 99 mmol/L (ref 96–106)
Creatinine, Ser: 1.15 mg/dL (ref 0.76–1.27)
Globulin, Total: 2.8 g/dL (ref 1.5–4.5)
Glucose: 104 mg/dL — ABNORMAL HIGH (ref 70–99)
Potassium: 4.8 mmol/L (ref 3.5–5.2)
Sodium: 139 mmol/L (ref 134–144)
Total Protein: 7.6 g/dL (ref 6.0–8.5)
eGFR: 78 mL/min/{1.73_m2} (ref >59)

## 2024-06-16 ENCOUNTER — Other Ambulatory Visit: Payer: Self-pay

## 2024-06-16 ENCOUNTER — Other Ambulatory Visit (HOSPITAL_COMMUNITY): Payer: Self-pay

## 2024-06-16 ENCOUNTER — Encounter: Payer: Self-pay | Admitting: Nurse Practitioner

## 2024-06-16 ENCOUNTER — Ambulatory Visit (INDEPENDENT_AMBULATORY_CARE_PROVIDER_SITE_OTHER): Payer: Self-pay | Admitting: Nurse Practitioner

## 2024-06-16 VITALS — BP 117/85 | HR 85 | Temp 98.1°F | Wt 158.2 lb

## 2024-06-16 DIAGNOSIS — E1165 Type 2 diabetes mellitus with hyperglycemia: Secondary | ICD-10-CM | POA: Diagnosis not present

## 2024-06-16 LAB — POCT GLYCOSYLATED HEMOGLOBIN (HGB A1C): Hemoglobin A1C: 6.1 % — AB (ref 4.0–5.6)

## 2024-06-16 MED ORDER — GLUCOSE BLOOD VI STRP
1.0000 | ORAL_STRIP | Freq: Three times a day (TID) | 0 refills | Status: DC
  Filled 2024-06-16: qty 100, 34d supply, fill #0
  Filled 2024-06-16: qty 100, 30d supply, fill #0

## 2024-06-16 MED ORDER — GLUCOSE BLOOD VI STRP: ORAL_STRIP | 0 refills | Status: DC

## 2024-06-16 NOTE — Progress Notes (Signed)
 Subjective   Patient ID: Mitchell Schmidt, male    DOB: Jul 10, 1976, 48 y.o.   MRN: [id number]  Chief Complaint  Patient presents with   Medical Management of Chronic Issues    Referring provider: Oley Mitchell RAMAN, NP  Mitchell Schmidt is a 48 y.o. male with Past Medical History: No date: Allergy 10/12/2023: Dyslipidemia, goal LDL below 70 No date: Elevated LFTs No date: Pre-diabetes 09/10/2014: Prediabetes 10/12/2023: Type 2 diabetes mellitus with hyperglycemia, without long- term current use of insulin (HCC)   HPI  Mr Mitchell Schmidt  has a past medical history of Allergy, Dyslipidemia, goal LDL below 70 (10/12/2023), Elevated LFTs, Pre-diabetes, Prediabetes (09/10/2014), and Type 2 diabetes mellitus with hyperglycemia, without long-term current use of insulin (HCC) (10/12/2023).    Patient presents for follow-up for diabetes. Metformin  500 mg twice daily will be refilled.    He reports no polyuria, no polyphagia polydipsia.  A1c in office today is 6.1.  Patient denies fever, chills, chest pain, shortness of breath nausea vomiting diarrhea     No Known Allergies  Immunization History  Administered Date(s) Administered   PFIZER(Purple Top)SARS-COV-2 Vaccination 03/18/2020, 04/10/2020   Tdap 12/09/2018    Tobacco History: Social History   Tobacco Use  Smoking Status Never  Smokeless Tobacco Never   Counseling given: Not Answered   Outpatient Encounter Medications as of 06/16/2024  Medication Sig   blood glucose meter kit and supplies Dispense based on patient and insurance preference. Use up to four times daily as directed. (FOR ICD-10 E10.9, E11.9).   Blood Glucose Monitoring Suppl (BLOOD GLUCOSE MONITOR SYSTEM) w/Device KIT Use to check blood glucose in the morning, at noon, and at bedtime.   metFORMIN  (GLUCOPHAGE ) 500 MG tablet Take 1 tablet (500 mg total) by mouth 2 (two) times daily with a meal.   [DISCONTINUED] glucose blood test strip Use as directed in the morning, at  noon and at bedtime.   glucose blood test strip Use as directed in the morning, at noon and at bedtime.   [DISCONTINUED] glucose blood test strip Use as directed in the morning, at noon and at bedtime.   No facility-administered encounter medications on file as of 06/16/2024.    Review of Systems  Review of Systems  Constitutional: Negative.   HENT: Negative.    Cardiovascular: Negative.   Gastrointestinal: Negative.   Allergic/Immunologic: Negative.   Neurological: Negative.   Psychiatric/Behavioral: Negative.       Objective:   BP 117/85   Pulse 85   Temp 98.1 F (36.7 C) (Oral)   Wt 158 lb 3.2 oz (71.8 kg)   SpO2 100%   BMI 22.70 kg/m   Wt Readings from Last 5 Encounters:  06/16/24 158 lb 3.2 oz (71.8 kg)  03/16/24 165 lb 6.4 oz (75 kg)  10/12/23 166 lb (75.3 kg)  09/17/23 166 lb 9.6 oz (75.6 kg)  07/08/20 168 lb (76.2 kg)     Physical Exam Vitals and nursing note reviewed.  Constitutional:      General: He is not in acute distress.    Appearance: He is well-developed.  Cardiovascular:     Rate and Rhythm: Normal rate and regular rhythm.  Pulmonary:     Effort: Pulmonary effort is normal.     Breath sounds: Normal breath sounds.  Skin:    General: Skin is warm and dry.  Neurological:     Mental Status: He is alert and oriented to person, place, and time.       Assessment &  Plan:   Type 2 diabetes mellitus with hyperglycemia, without long-term current use of insulin (HCC) -     POCT glycosylated hemoglobin (Hb A1C)  Other orders -     Glucose Blood; Use as directed in the morning, at noon and at bedtime.  Dispense: 100 each; Refill: 0     Return in about 3 months (around 09/16/2024) for Physical.   Mitchell GORMAN Borer, NP 06/16/2024

## 2024-09-22 ENCOUNTER — Ambulatory Visit (INDEPENDENT_AMBULATORY_CARE_PROVIDER_SITE_OTHER): Payer: Self-pay | Admitting: Nurse Practitioner

## 2024-09-22 ENCOUNTER — Encounter: Payer: Self-pay | Admitting: Nurse Practitioner

## 2024-09-22 ENCOUNTER — Other Ambulatory Visit (HOSPITAL_COMMUNITY): Payer: Self-pay

## 2024-09-22 VITALS — BP 114/75 | HR 82 | Wt 152.4 lb

## 2024-09-22 DIAGNOSIS — E1165 Type 2 diabetes mellitus with hyperglycemia: Secondary | ICD-10-CM

## 2024-09-22 DIAGNOSIS — Z125 Encounter for screening for malignant neoplasm of prostate: Secondary | ICD-10-CM

## 2024-09-22 DIAGNOSIS — R350 Frequency of micturition: Secondary | ICD-10-CM

## 2024-09-22 DIAGNOSIS — R809 Proteinuria, unspecified: Secondary | ICD-10-CM | POA: Diagnosis not present

## 2024-09-22 LAB — POCT URINALYSIS DIP (CLINITEK)
Bilirubin, UA: NEGATIVE
Blood, UA: NEGATIVE
Glucose, UA: NEGATIVE mg/dL
Ketones, POC UA: NEGATIVE mg/dL
Leukocytes, UA: NEGATIVE
Nitrite, UA: NEGATIVE
POC PROTEIN,UA: 30 — AB
Spec Grav, UA: 1.015 (ref 1.010–1.025)
Urobilinogen, UA: 0.2 U/dL
pH, UA: 8.5 — AB (ref 5.0–8.0)

## 2024-09-22 LAB — POCT GLYCOSYLATED HEMOGLOBIN (HGB A1C): Hemoglobin A1C: 6.4 % — AB (ref 4.0–5.6)

## 2024-09-22 MED ORDER — GLUCOSE BLOOD VI STRP: ORAL_STRIP | 12 refills | Status: DC

## 2024-09-22 MED ORDER — GLUCOSE BLOOD VI STRP
ORAL_STRIP | 12 refills | Status: AC
  Filled 2024-09-22: qty 50, 50d supply, fill #0

## 2024-09-22 NOTE — Progress Notes (Signed)
 Subjective   Patient ID: Mitchell Schmidt, male    DOB: 1976-03-23, 48 y.o.   MRN: [id number]  Chief Complaint  Patient presents with   Urinary Frequency    Throughout the night, intermittent, sometimes its 3-4 times and disturbs sleep.   Medication Refill    Contour test strips x50   Follow-up    Referring provider: Oley Bascom RAMAN, NP  Mitchell Schmidt is a 48 y.o. male with Past Medical History: No date: Allergy 10/12/2023: Dyslipidemia, goal LDL below 70 No date: Elevated LFTs No date: Pre-diabetes 09/10/2014: Prediabetes 10/12/2023: Type 2 diabetes mellitus with hyperglycemia, without long- term current use of insulin (HCC)   HPI  Patient presents today for follow-up visit for diabetes.  He states that he has been having some urinary frequency at night.  UA in office shows proteinuria.  We will place a referral to urology.  We will check prostate level today. A1C in office today is 6.4. Denies f/c/s, n/v/d, hemoptysis, PND, leg swelling Denies chest pain or edema     No Known Allergies  Immunization History  Administered Date(s) Administered   PFIZER(Purple Top)SARS-COV-2 Vaccination 03/18/2020, 04/10/2020   Tdap 12/09/2018    Tobacco History: Social History   Tobacco Use  Smoking Status Never  Smokeless Tobacco Never   Counseling given: Not Answered   Outpatient Encounter Medications as of 09/22/2024  Medication Sig   blood glucose meter kit and supplies Dispense based on patient and insurance preference. Use up to four times daily as directed. (FOR ICD-10 E10.9, E11.9). (Patient taking differently: Dispense based on patient and insurance preference. Use up to four times daily as directed. (FOR ICD-10 E10.9, E11.9). CONTOUR NEXT TEST STRIPS ONLY)   Blood Glucose Monitoring Suppl (BLOOD GLUCOSE MONITOR SYSTEM) w/Device KIT Use to check blood glucose in the morning, at noon, and at bedtime. (Patient taking differently: 1 each by Does not apply route in the  morning, at noon, and at bedtime. CONTOUR NEXT TEST STRIPS ONLY)   metFORMIN  (GLUCOPHAGE ) 500 MG tablet Take 1 tablet (500 mg total) by mouth 2 (two) times daily with a meal.   [DISCONTINUED] glucose blood test strip Use as directed in the morning, at noon and at bedtime. (Patient taking differently: 1 each by Other route 3 (three) times daily. CONTOUR NEXT TEST STRIPS ONLY)   [DISCONTINUED] glucose blood test strip Use as instructed   glucose blood test strip Use as instructed   No facility-administered encounter medications on file as of 09/22/2024.    Review of Systems  Review of Systems  Constitutional: Negative.   HENT: Negative.    Cardiovascular: Negative.   Gastrointestinal: Negative.   Allergic/Immunologic: Negative.   Neurological: Negative.   Psychiatric/Behavioral: Negative.       Objective:   BP 114/75 (BP Location: Left Arm, Patient Position: Sitting)   Pulse 82   Wt 152 lb 6.4 oz (69.1 kg)   SpO2 99%   BMI 21.87 kg/m   Wt Readings from Last 5 Encounters:  09/22/24 152 lb 6.4 oz (69.1 kg)  06/16/24 158 lb 3.2 oz (71.8 kg)  03/16/24 165 lb 6.4 oz (75 kg)  10/12/23 166 lb (75.3 kg)  09/17/23 166 lb 9.6 oz (75.6 kg)     Physical Exam Vitals and nursing note reviewed.  Constitutional:      General: He is not in acute distress.    Appearance: He is well-developed.  Cardiovascular:     Rate and Rhythm: Regular rhythm.  Pulmonary:  Effort: Pulmonary effort is normal.     Breath sounds: Normal breath sounds.  Skin:    General: Skin is warm and dry.  Neurological:     Mental Status: He is alert and oriented to person, place, and time.       Assessment & Plan:   Urinary frequency -     POCT URINALYSIS DIP (CLINITEK) -     PSA -     POCT glycosylated hemoglobin (Hb A1C) -     Ambulatory referral to Urology  Prostate cancer screening -     PSA  Type 2 diabetes mellitus with hyperglycemia, without long-term current use of insulin (HCC) -      Microalbumin / creatinine urine ratio -     Ambulatory referral to Urology -     Glucose Blood; Use as instructed  Dispense: 100 each; Refill: 12  Proteinuria, unspecified type -     Ambulatory referral to Urology     Return in about 6 months (around 03/22/2025).   Bascom GORMAN Borer, NP 09/22/2024

## 2024-09-23 LAB — MICROALBUMIN / CREATININE URINE RATIO
Creatinine, Urine: 125.6 mg/dL
Microalb/Creat Ratio: 5 mg/g{creat} (ref 0–29)
Microalbumin, Urine: 6.5 ug/mL

## 2024-09-23 LAB — PSA: Prostate Specific Ag, Serum: 0.7 ng/mL (ref 0.0–4.0)

## 2024-09-25 ENCOUNTER — Ambulatory Visit: Payer: Self-pay | Admitting: Nurse Practitioner

## 2025-03-23 ENCOUNTER — Ambulatory Visit: Payer: Self-pay | Admitting: Nurse Practitioner
